# Patient Record
Sex: Male | Born: 1945
Health system: Southern US, Community
[De-identification: ages and names within clinical notes are randomized; demographics above are authoritative.]

## PROBLEM LIST (undated history)

## (undated) DIAGNOSIS — E7800 Pure hypercholesterolemia, unspecified: Secondary | ICD-10-CM

## (undated) DIAGNOSIS — R001 Bradycardia, unspecified: Secondary | ICD-10-CM

## (undated) DIAGNOSIS — I340 Nonrheumatic mitral (valve) insufficiency: Secondary | ICD-10-CM

## (undated) DIAGNOSIS — G8929 Other chronic pain: Secondary | ICD-10-CM

## (undated) DIAGNOSIS — N529 Male erectile dysfunction, unspecified: Secondary | ICD-10-CM

## (undated) DIAGNOSIS — I351 Nonrheumatic aortic (valve) insufficiency: Secondary | ICD-10-CM

## (undated) DIAGNOSIS — M549 Dorsalgia, unspecified: Secondary | ICD-10-CM

## (undated) DIAGNOSIS — I491 Atrial premature depolarization: Secondary | ICD-10-CM

## (undated) DIAGNOSIS — I493 Ventricular premature depolarization: Secondary | ICD-10-CM

## (undated) DIAGNOSIS — K219 Gastro-esophageal reflux disease without esophagitis: Secondary | ICD-10-CM

## (undated) DIAGNOSIS — I471 Supraventricular tachycardia: Secondary | ICD-10-CM

## (undated) DIAGNOSIS — N400 Enlarged prostate without lower urinary tract symptoms: Secondary | ICD-10-CM

## (undated) DIAGNOSIS — I483 Typical atrial flutter: Secondary | ICD-10-CM

## (undated) DIAGNOSIS — I4719 Other supraventricular tachycardia: Secondary | ICD-10-CM

## (undated) HISTORY — DX: Nonrheumatic mitral (valve) insufficiency: I34.0

## (undated) HISTORY — DX: Gastro-esophageal reflux disease without esophagitis: K21.9

## (undated) HISTORY — DX: Pure hypercholesterolemia, unspecified: E78.00

## (undated) HISTORY — DX: Dorsalgia, unspecified: M54.9

## (undated) HISTORY — DX: Atrial premature depolarization: I49.1

## (undated) HISTORY — DX: Nonrheumatic aortic (valve) insufficiency: I35.1

## (undated) HISTORY — DX: Other chronic pain: G89.29

## (undated) HISTORY — PX: APPENDECTOMY: SHX54

## (undated) HISTORY — DX: Male erectile dysfunction, unspecified: N52.9

## (undated) HISTORY — DX: Ventricular premature depolarization: I49.3

## (undated) HISTORY — DX: Benign prostatic hyperplasia without lower urinary tract symptoms: N40.0

## (undated) HISTORY — DX: Supraventricular tachycardia: I47.1

## (undated) HISTORY — DX: Typical atrial flutter: I48.3

## (undated) HISTORY — DX: Other supraventricular tachycardia: I47.19

## (undated) HISTORY — DX: Bradycardia, unspecified: R00.1

---

## 1965-12-22 HISTORY — PX: TONSILECTOMY/ADENOIDECTOMY WITH MYRINGOTOMY: SHX6125

## 1998-07-19 ENCOUNTER — Ambulatory Visit (HOSPITAL_COMMUNITY): Admission: RE | Admit: 1998-07-19 | Discharge: 1998-07-19 | Payer: Self-pay | Admitting: Emergency Medicine

## 1998-07-19 ENCOUNTER — Emergency Department (HOSPITAL_COMMUNITY): Admission: EM | Admit: 1998-07-19 | Discharge: 1998-07-19 | Payer: Self-pay | Admitting: Emergency Medicine

## 2001-01-26 ENCOUNTER — Ambulatory Visit (HOSPITAL_COMMUNITY): Admission: RE | Admit: 2001-01-26 | Discharge: 2001-01-26 | Payer: Self-pay | Admitting: Gastroenterology

## 2003-07-10 ENCOUNTER — Encounter: Payer: Self-pay | Admitting: Internal Medicine

## 2003-07-10 ENCOUNTER — Encounter: Admission: RE | Admit: 2003-07-10 | Discharge: 2003-07-10 | Payer: Self-pay | Admitting: Internal Medicine

## 2010-01-16 ENCOUNTER — Observation Stay (HOSPITAL_COMMUNITY): Admission: EM | Admit: 2010-01-16 | Discharge: 2010-01-17 | Payer: Self-pay | Admitting: Emergency Medicine

## 2010-01-16 ENCOUNTER — Ambulatory Visit: Payer: Self-pay | Admitting: Cardiology

## 2011-03-10 LAB — CK TOTAL AND CKMB (NOT AT ARMC)
CK, MB: 2.4 ng/mL (ref 0.3–4.0)
Total CK: 124 U/L (ref 7–232)

## 2011-03-10 LAB — DIFFERENTIAL
Eosinophils Absolute: 0.1 10*3/uL (ref 0.0–0.7)
Lymphocytes Relative: 22 % (ref 12–46)
Monocytes Absolute: 0.4 10*3/uL (ref 0.1–1.0)
Monocytes Relative: 8 % (ref 3–12)
Neutrophils Relative %: 68 % (ref 43–77)

## 2011-03-10 LAB — CARDIAC PANEL(CRET KIN+CKTOT+MB+TROPI)
CK, MB: 2 ng/mL (ref 0.3–4.0)
CK, MB: 2 ng/mL (ref 0.3–4.0)
Total CK: 78 U/L (ref 7–232)
Troponin I: 0.04 ng/mL (ref 0.00–0.06)

## 2011-03-10 LAB — CBC
HCT: 44.6 % (ref 39.0–52.0)
MCV: 86.6 fL (ref 78.0–100.0)
Platelets: 270 10*3/uL (ref 150–400)
RDW: 12.8 % (ref 11.5–15.5)

## 2011-03-10 LAB — COMPREHENSIVE METABOLIC PANEL
ALT: 17 U/L (ref 0–53)
Alkaline Phosphatase: 64 U/L (ref 39–117)
CO2: 26 mEq/L (ref 19–32)
Calcium: 9.3 mg/dL (ref 8.4–10.5)
Creatinine, Ser: 1.23 mg/dL (ref 0.4–1.5)
GFR calc Af Amer: >60 mL/min (ref >60)
GFR calc non Af Amer: 59 mL/min — ABNORMAL LOW (ref >60)
Total Protein: 7.4 g/dL (ref 6.0–8.3)

## 2011-03-10 LAB — TROPONIN I: Troponin I: 0.02 ng/mL (ref 0.00–0.06)

## 2013-05-26 ENCOUNTER — Ambulatory Visit: Payer: Medicare Other | Attending: Family Medicine | Admitting: Rehabilitation

## 2013-05-26 DIAGNOSIS — IMO0001 Reserved for inherently not codable concepts without codable children: Secondary | ICD-10-CM | POA: Insufficient documentation

## 2013-05-26 DIAGNOSIS — M25539 Pain in unspecified wrist: Secondary | ICD-10-CM | POA: Insufficient documentation

## 2013-08-01 ENCOUNTER — Other Ambulatory Visit: Payer: Self-pay | Admitting: Orthopaedic Surgery

## 2013-08-01 DIAGNOSIS — M25521 Pain in right elbow: Secondary | ICD-10-CM

## 2013-08-06 ENCOUNTER — Ambulatory Visit (HOSPITAL_BASED_OUTPATIENT_CLINIC_OR_DEPARTMENT_OTHER)
Admission: RE | Admit: 2013-08-06 | Discharge: 2013-08-06 | Disposition: A | Discharge status: Home / Self Care | Payer: Medicare Other | Source: Ambulatory Visit | Attending: Orthopaedic Surgery | Admitting: Orthopaedic Surgery

## 2013-08-06 DIAGNOSIS — M6688 Spontaneous rupture of other tendons, other: Secondary | ICD-10-CM | POA: Insufficient documentation

## 2013-08-06 DIAGNOSIS — M25521 Pain in right elbow: Secondary | ICD-10-CM

## 2013-08-09 ENCOUNTER — Other Ambulatory Visit (HOSPITAL_BASED_OUTPATIENT_CLINIC_OR_DEPARTMENT_OTHER): Payer: Medicare Other

## 2013-08-22 HISTORY — PX: TRICEPS TENDON REPAIR: SHX2577

## 2013-09-08 ENCOUNTER — Ambulatory Visit: Payer: Medicare Other | Attending: Orthopaedic Surgery | Admitting: Physical Therapy

## 2013-09-08 DIAGNOSIS — R293 Abnormal posture: Secondary | ICD-10-CM | POA: Insufficient documentation

## 2013-09-08 DIAGNOSIS — M25539 Pain in unspecified wrist: Secondary | ICD-10-CM | POA: Insufficient documentation

## 2013-09-08 DIAGNOSIS — M25629 Stiffness of unspecified elbow, not elsewhere classified: Secondary | ICD-10-CM | POA: Insufficient documentation

## 2013-09-08 DIAGNOSIS — IMO0001 Reserved for inherently not codable concepts without codable children: Secondary | ICD-10-CM | POA: Insufficient documentation

## 2013-09-08 DIAGNOSIS — R5381 Other malaise: Secondary | ICD-10-CM | POA: Insufficient documentation

## 2013-09-13 ENCOUNTER — Ambulatory Visit: Payer: Medicare Other | Admitting: Physical Therapy

## 2013-09-15 ENCOUNTER — Ambulatory Visit: Payer: Medicare Other | Admitting: Physical Therapy

## 2013-09-22 ENCOUNTER — Encounter: Payer: Medicare Other | Admitting: Physical Therapy

## 2013-10-03 ENCOUNTER — Encounter: Payer: Medicare Other | Admitting: Physical Therapy

## 2013-10-11 ENCOUNTER — Ambulatory Visit: Payer: Medicare Other | Admitting: Physical Therapy

## 2013-10-13 ENCOUNTER — Ambulatory Visit: Payer: Medicare Other | Attending: Orthopaedic Surgery | Admitting: Physical Therapy

## 2013-10-13 DIAGNOSIS — IMO0001 Reserved for inherently not codable concepts without codable children: Secondary | ICD-10-CM | POA: Insufficient documentation

## 2013-10-13 DIAGNOSIS — R293 Abnormal posture: Secondary | ICD-10-CM | POA: Insufficient documentation

## 2013-10-13 DIAGNOSIS — R5381 Other malaise: Secondary | ICD-10-CM | POA: Insufficient documentation

## 2013-10-13 DIAGNOSIS — M25539 Pain in unspecified wrist: Secondary | ICD-10-CM | POA: Insufficient documentation

## 2013-10-13 DIAGNOSIS — M25629 Stiffness of unspecified elbow, not elsewhere classified: Secondary | ICD-10-CM | POA: Insufficient documentation

## 2013-10-20 ENCOUNTER — Ambulatory Visit: Payer: Medicare Other | Admitting: Physical Therapy

## 2013-10-24 ENCOUNTER — Ambulatory Visit: Payer: Medicare Other | Admitting: Physical Therapy

## 2013-10-27 ENCOUNTER — Ambulatory Visit: Payer: Medicare Other | Attending: Orthopaedic Surgery | Admitting: Physical Therapy

## 2013-10-27 DIAGNOSIS — R293 Abnormal posture: Secondary | ICD-10-CM | POA: Insufficient documentation

## 2013-10-27 DIAGNOSIS — R5381 Other malaise: Secondary | ICD-10-CM | POA: Insufficient documentation

## 2013-10-27 DIAGNOSIS — M25539 Pain in unspecified wrist: Secondary | ICD-10-CM | POA: Insufficient documentation

## 2013-10-27 DIAGNOSIS — IMO0001 Reserved for inherently not codable concepts without codable children: Secondary | ICD-10-CM | POA: Insufficient documentation

## 2013-10-27 DIAGNOSIS — M25629 Stiffness of unspecified elbow, not elsewhere classified: Secondary | ICD-10-CM | POA: Insufficient documentation

## 2016-01-01 DIAGNOSIS — R7301 Impaired fasting glucose: Secondary | ICD-10-CM | POA: Diagnosis not present

## 2016-07-11 DIAGNOSIS — E784 Other hyperlipidemia: Secondary | ICD-10-CM | POA: Diagnosis not present

## 2016-07-11 DIAGNOSIS — Z125 Encounter for screening for malignant neoplasm of prostate: Secondary | ICD-10-CM | POA: Diagnosis not present

## 2016-07-11 DIAGNOSIS — R7301 Impaired fasting glucose: Secondary | ICD-10-CM | POA: Diagnosis not present

## 2016-07-18 ENCOUNTER — Encounter: Payer: Self-pay | Admitting: Internal Medicine

## 2016-07-18 DIAGNOSIS — R001 Bradycardia, unspecified: Secondary | ICD-10-CM | POA: Diagnosis not present

## 2016-07-18 DIAGNOSIS — Z0000 Encounter for general adult medical examination without abnormal findings: Secondary | ICD-10-CM | POA: Diagnosis not present

## 2016-07-18 DIAGNOSIS — I4892 Unspecified atrial flutter: Secondary | ICD-10-CM | POA: Diagnosis not present

## 2016-07-18 DIAGNOSIS — R600 Localized edema: Secondary | ICD-10-CM | POA: Diagnosis not present

## 2016-07-18 DIAGNOSIS — I499 Cardiac arrhythmia, unspecified: Secondary | ICD-10-CM | POA: Diagnosis not present

## 2016-07-18 DIAGNOSIS — M5489 Other dorsalgia: Secondary | ICD-10-CM | POA: Diagnosis not present

## 2016-07-18 DIAGNOSIS — Z6827 Body mass index (BMI) 27.0-27.9, adult: Secondary | ICD-10-CM | POA: Diagnosis not present

## 2016-07-18 DIAGNOSIS — Z1212 Encounter for screening for malignant neoplasm of rectum: Secondary | ICD-10-CM | POA: Diagnosis not present

## 2016-07-18 DIAGNOSIS — M7021 Olecranon bursitis, right elbow: Secondary | ICD-10-CM | POA: Diagnosis not present

## 2016-07-18 DIAGNOSIS — R7301 Impaired fasting glucose: Secondary | ICD-10-CM | POA: Diagnosis not present

## 2016-07-18 DIAGNOSIS — F418 Other specified anxiety disorders: Secondary | ICD-10-CM | POA: Diagnosis not present

## 2016-07-18 DIAGNOSIS — E784 Other hyperlipidemia: Secondary | ICD-10-CM | POA: Diagnosis not present

## 2016-07-18 DIAGNOSIS — Z1389 Encounter for screening for other disorder: Secondary | ICD-10-CM | POA: Diagnosis not present

## 2016-07-23 DIAGNOSIS — R600 Localized edema: Secondary | ICD-10-CM | POA: Diagnosis not present

## 2016-07-23 DIAGNOSIS — M7122 Synovial cyst of popliteal space [Baker], left knee: Secondary | ICD-10-CM | POA: Diagnosis not present

## 2016-07-24 ENCOUNTER — Encounter: Payer: Self-pay | Admitting: Internal Medicine

## 2016-07-24 ENCOUNTER — Encounter (INDEPENDENT_AMBULATORY_CARE_PROVIDER_SITE_OTHER): Payer: Self-pay

## 2016-07-24 ENCOUNTER — Ambulatory Visit (INDEPENDENT_AMBULATORY_CARE_PROVIDER_SITE_OTHER): Payer: PPO | Admitting: Internal Medicine

## 2016-07-24 VITALS — BP 125/84 | HR 74 | Ht 74.0 in | Wt 217.0 lb

## 2016-07-24 DIAGNOSIS — I4892 Unspecified atrial flutter: Secondary | ICD-10-CM | POA: Diagnosis not present

## 2016-07-24 NOTE — Progress Notes (Signed)
ELECTROPHYSIOLOGY CONSULT NOTE  Patient ID: Austin Parks, MRN: [id number], DOB/AGE: [birth year] 70 y.o. Admit date: (Not on file) Date of Consult: 07/24/2016  Primary Physician: No primary care provider on file. Primary Cardiologist: new  `   Consulting Physician MPerini  Chief Complaint: atrial    HPI Austin Parks is a 70 y.o. male  Referred because of atrial flutter. It was identified during a physical examination. The patient has had no attributable symptoms. There are no palpitations he has noted no change in exercise tolerance.  CHADS-VASc score is 1-age  He is able to climb stairs he is able to play golfperipheral edema orthopnea. He has had no exertional chest discomfort.  He has no history of bleeding disorder. His past medical record describes bradycardia.      Past Medical History:  Diagnosis Date  . Back pain, chronic   . Benign prostatic hypertrophy    with obstruction  . Bradycardia    ectopic  . Erectile dysfunction of organic origin   . Gastroesophageal reflux disease   . Hypercholesterolemia    borderline. admitted january 2011 for chest pain-all stress related it was felt to be a-flutter 7/17      Surgical History:  Past Surgical History:  Procedure Laterality Date  . TRICEPS TENDON REPAIR  08/2013   Dr. [name]     Home Meds: Prior to Admission medications   Medication Sig Start Date End Date Taking? Authorizing Provider  aspirin 81 MG tablet Take 81 mg by mouth once a week.    Yes Historical Provider, MD  cholecalciferol (VITAMIN D) 1000 units tablet Take 2,000 Units by mouth daily.   Yes Historical Provider, MD  Coenzyme Q10 100 MG CHEW Chew 2 tablets by mouth daily.   Yes Historical Provider, MD  Multiple Vitamins-Minerals (MULTIVITAMIN ADULT PO) Take 1 tablet by mouth daily. Centrum Silver Senior for men   Yes Historical Provider, MD  Omega-3 Fatty Acids (FISH OIL PO) Take 1,200 mg by mouth daily.   Yes Historical Provider,  MD  rosuvastatin (CRESTOR) 40 MG tablet Take by mouth at bedtime. Take 0.5 tablet by mouth daily   Yes Historical Provider, MD    Allergies: No Known Allergies  Social History   Social History  . Marital status: Married    Spouse name: N/A  . Number of children: 2  . Years of education: college   Occupational History  . 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    Social History Main Topics  . Smoking status: Never Smoker  . Smokeless tobacco: Former Systems developer  . Alcohol use No  . Drug use: No  . Sexual activity: Not on file   Other Topics Concern  . 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 He lives with his 2nd wife of 30+ years (prior marriage lasted 3 years) and has 2 children. Patient denies history of alcohol and tobacco use. [name] and [name] children. [name] is peds resident at [facility] as of 2011) retired 2/12. In 2016 still working some. Part time job at [employer] and [employer].     Family History  Problem Relation Age of Onset  . Lung cancer Mother   . Brain cancer Mother   . Stomach cancer Father   . Diabetes Mellitus II Paternal Aunt   . Diabetes Mellitus I Daughter  ROS:  Please see the history of present illness.     All other systems reviewed and negative.    Physical Exam: Blood pressure 125/84, pulse 74, height 6\' 2"  (1.88 m), weight 217 lb (98.4 kg), SpO2 97 %. General: Well developed, well nourished male in no acute distress. Head: Normocephalic, atraumatic, sclera non-icteric, no xanthomas, nares are without discharge. EENT: normal  Lymph Nodes:  none Neck: Negative for carotid bruits. JVD not elevated. Back:without scoliosis kyphosis  Lungs: Clear bilaterally to auscultation without wheezes, rales, or rhonchi. Breathing is unlabored. Heart: RRR with S1 S2. No   murmur . No rubs, or gallops appreciated. Abdomen: Soft, non-tender,  non-distended with normoactive bowel sounds. No hepatomegaly. No rebound/guarding. No obvious abdominal masses. Msk:  Strength and tone appear normal for age. Extremities: No clubbing or cyanosis. No edema.  Distal pedal pulses are 2+ and equal bilaterally. Skin: Warm and Dry Neuro: Alert and oriented X 3. CN III-XII intact Grossly normal sensory and motor function . Psych:  Responds to questions appropriately with a normal affect.      Labs: Cardiac Enzymes No results for input(s): CKTOTAL, CKMB, TROPONINI in the last 72 hours. CBC Lab Results  Component Value Date   WBC 5.2 01/16/2010   HGB 15.2 01/16/2010   HCT 44.6 01/16/2010   MCV 86.6 01/16/2010   PLT 270 01/16/2010   PROTIME: No results for input(s): LABPROT, INR in the last 72 hours. Chemistry No results for input(s): NA, K, CL, CO2, BUN, CREATININE, CALCIUM, PROT, BILITOT, ALKPHOS, ALT, AST, GLUCOSE in the last 168 hours.  Invalid input(s): LABALBU Lipids No results found for: CHOL, HDL, LDLCALC, TRIG BNP No results found for: PROBNP Thyroid Function Tests: No results for input(s): TSH, T4TOTAL, T3FREE, THYROIDAB in the last 72 hours.  Invalid input(s): FREET3 Miscellaneous Lab Results  Component Value Date   DDIMER  01/16/2010    <0.22        AT THE INHOUSE ESTABLISHED CUTOFF VALUE OF 0.48 ug/mL FEU, THIS ASSAY HAS BEEN DOCUMENTED IN THE LITERATURE TO HAVE A SENSITIVITY AND NEGATIVE PREDICTIVE VALUE OF AT LEAST 98 TO 99%.  THE TEST RESULT SHOULD BE CORRELATED WITH AN ASSESSMENT OF THE CLINICAL PROBABILITY OF DVT / VTE.    Radiology/Studies:  No results found.  EKG: Atrial flutter-atypical with upright flutter waves inferiorly negative flutter waves in V1 atrial cycle length 220 ms   Assessment and Plan:    Atrial flutter-atypical    the patient has atypical atrial flutter. In the absence of prior cardiac instrumentation, it is certainly more likely than not that it represents reverse typical  flutter. Hence, catheter ablation becomes a reasonable primary therapy. His atrial flutter is of unknown duration so antecedent anticoagulation is required of greater than 3 weeks. In the absence of symptoms there is no justification for doing a TEE.  We have reviewed from embolic risks for a CHADS-VASc score of 1, intracranial hemorrhage risks associated with NOAC therapy and the relative risks of ablative therapy.  We will anticipate a echocardiogram to look at left ventricular function left atrial size. We anticipate proceeding with catheter ablation the risks and benefits of which we have reviewed and postprocedural anticoagulation for at least 4 weeks.  We have discussed the potential for atrial fibrillation in the wake of atrial flutter ablation. He will learn to track his pulse to help identify atrial fibrillation in the event that it emerges  We discussed the lack of long-term utility of cardioversion. Atrial flutter almost  always recurs. His absence of symptoms obviates   other benefit.    [name]

## 2016-07-24 NOTE — Patient Instructions (Signed)
Medication Instructions: - Your physician recommends that you continue on your current medications as directed. Please refer to the Current Medication list given to you today.  - Please call when you decide which blood thinner you want to start 1) Eliquis one tablet twice daily 2) Pradaxa one tablet twice daily 3) Xarelto one tablet once daily  Labwork: - pending procedure date  Procedures/Testing: - Your physician has requested that you have an echocardiogram. Echocardiography is a painless test that uses sound waves to create images of your heart. It provides your doctor with information about the size and shape of your heart and how well your heart's chambers and valves are working. This procedure takes approximately one hour. There are no restrictions for this procedure.  - Your physician has recommended that you have an ablation- in about 1 month- Kajal Scalici, RN for Dr. Caryl Comes will call you to schedule.. Catheter ablation is a medical procedure used to treat some cardiac arrhythmias (irregular heartbeats). During catheter ablation, a long, thin, flexible tube is put into a blood vessel in your groin (upper thigh), or neck. This tube is called an ablation catheter. It is then guided to your heart through the blood vessel. Radio frequency waves destroy small areas of heart tissue where abnormal heartbeats may cause an arrhythmia to start. Please see the instruction sheet given to you today.  Follow-Up: - pending procedure date  Any Additional Special Instructions Will Be Listed Below (If Applicable).     If you need a refill on your cardiac medications before your next appointment, please call your pharmacy.

## 2016-07-25 ENCOUNTER — Telehealth: Payer: Self-pay | Admitting: Internal Medicine

## 2016-07-25 MED ORDER — APIXABAN 5 MG PO TABS: 5.0000 mg | ORAL_TABLET | Freq: Two times a day (BID) | ORAL | 6 refills | Status: DC

## 2016-07-25 NOTE — Telephone Encounter (Signed)
New message       Pt c/o medication issue:  1. Name of Medication: Eliquis 2. How are you currently taking this medication (dosage and times per day)? uncertain  3. Are you having a reaction (difficulty breathing--STAT)? no  4. What is your medication issue? Pt was instructed to call back and asked to choose the blood thinner, the pt uses [facility], the Md is going to call it in for the pt

## 2016-07-25 NOTE — Telephone Encounter (Signed)
ELIQUIS  5 MB  BID  SENT  VIA  EPIC  TO   [facility]  AT PT'S REQUEST .Adonis Housekeeper

## 2016-08-06 ENCOUNTER — Other Ambulatory Visit: Payer: Self-pay

## 2016-08-06 ENCOUNTER — Ambulatory Visit (HOSPITAL_COMMUNITY): Payer: PPO | Attending: Cardiology

## 2016-08-06 DIAGNOSIS — I351 Nonrheumatic aortic (valve) insufficiency: Secondary | ICD-10-CM | POA: Insufficient documentation

## 2016-08-06 DIAGNOSIS — I071 Rheumatic tricuspid insufficiency: Secondary | ICD-10-CM | POA: Diagnosis not present

## 2016-08-06 DIAGNOSIS — I4892 Unspecified atrial flutter: Secondary | ICD-10-CM | POA: Insufficient documentation

## 2016-08-06 DIAGNOSIS — I517 Cardiomegaly: Secondary | ICD-10-CM | POA: Insufficient documentation

## 2016-08-06 DIAGNOSIS — I340 Nonrheumatic mitral (valve) insufficiency: Secondary | ICD-10-CM | POA: Insufficient documentation

## 2016-08-06 DIAGNOSIS — E7800 Pure hypercholesterolemia, unspecified: Secondary | ICD-10-CM | POA: Diagnosis not present

## 2016-08-13 ENCOUNTER — Telehealth: Payer: Self-pay | Admitting: Internal Medicine

## 2016-08-13 DIAGNOSIS — I4892 Unspecified atrial flutter: Secondary | ICD-10-CM

## 2016-08-13 DIAGNOSIS — Z01812 Encounter for preprocedural laboratory examination: Secondary | ICD-10-CM

## 2016-08-13 NOTE — Telephone Encounter (Signed)
Spoke with patient and advised I will forward message to Dr. Olin Pia primary nurse, Alvis Lemmings, RN, for follow up.  He thanked me for the call.

## 2016-08-13 NOTE — Telephone Encounter (Signed)
New message    Patient calling checking on the status of upcoming ablation. Patient states he's been on blood thinner for 20 days.

## 2016-08-15 ENCOUNTER — Telehealth: Payer: Self-pay | Admitting: Internal Medicine

## 2016-08-15 NOTE — Telephone Encounter (Signed)
Follow Up:   Pt received echo results on my chart. Please call,still have some questions.

## 2016-08-19 ENCOUNTER — Encounter: Payer: Self-pay | Admitting: *Deleted

## 2016-08-19 NOTE — Telephone Encounter (Signed)
I called and spoke with the patient. He is scheduled for his a-flutter ablation for 08/22/16 at 7:30am.  He will come to the office tomorrow for labs/ EKG and formal instructions for his procedure on Friday .

## 2016-08-20 ENCOUNTER — Ambulatory Visit (INDEPENDENT_AMBULATORY_CARE_PROVIDER_SITE_OTHER): Payer: PPO | Admitting: *Deleted

## 2016-08-20 ENCOUNTER — Other Ambulatory Visit: Payer: PPO | Admitting: *Deleted

## 2016-08-20 DIAGNOSIS — Z01812 Encounter for preprocedural laboratory examination: Secondary | ICD-10-CM | POA: Diagnosis not present

## 2016-08-20 DIAGNOSIS — I4892 Unspecified atrial flutter: Secondary | ICD-10-CM

## 2016-08-20 LAB — BASIC METABOLIC PANEL
BUN: 16 mg/dL (ref 7–25)
CHLORIDE: 102 mmol/L (ref 98–110)
CO2: 23 mmol/L (ref 20–31)
Calcium: 9.2 mg/dL (ref 8.6–10.3)
Creat: 1.11 mg/dL (ref 0.70–1.18)
Glucose, Bld: 101 mg/dL — ABNORMAL HIGH (ref 65–99)
POTASSIUM: 4.4 mmol/L (ref 3.5–5.3)
SODIUM: 137 mmol/L (ref 135–146)

## 2016-08-20 LAB — CBC WITH DIFFERENTIAL/PLATELET
BASOS PCT: 1 %
Basophils Absolute: 49 cells/uL (ref 0–200)
EOS PCT: 4 %
Eosinophils Absolute: 196 cells/uL (ref 15–500)
HEMATOCRIT: 42.3 % (ref 38.5–50.0)
Hemoglobin: 14.3 g/dL (ref 13.2–17.1)
LYMPHS PCT: 28 %
Lymphs Abs: 1372 cells/uL (ref 850–3900)
MCH: 29.2 pg (ref 27.0–33.0)
MCHC: 33.8 g/dL (ref 32.0–36.0)
MCV: 86.3 fL (ref 80.0–100.0)
MONO ABS: 441 {cells}/uL (ref 200–950)
MONOS PCT: 9 %
MPV: 8.7 fL (ref 7.5–12.5)
NEUTROS PCT: 58 %
Neutro Abs: 2842 cells/uL (ref 1500–7800)
PLATELETS: 229 10*3/uL (ref 140–400)
RBC: 4.9 MIL/uL (ref 4.20–5.80)
RDW: 13.6 % (ref 11.0–15.0)
WBC: 4.9 10*3/uL (ref 3.8–10.8)

## 2016-08-20 NOTE — Progress Notes (Signed)
  1.) Reason for visit: - pre procedure EKG- pending atrial flutter ablation on 08/22/16  2.) Name of MD requesting visit: Caryl Comes  3.) H&P: Patient is currently pending an atrial flutter ablation on 08/22/16. He presents to the office today for a pre-procedure EKG to confirm is he is currently in NSR and therefore may hold anticoagulation prior to procedure  4.) ROS related to problem: The patient is currently symptom free in regards to his arrhythmia.   5.) Assessment and plan per MD: Per MD review of the patient's EKG today, he is currently still in atrial flutter- the patient has been advised to continue his Eliquis for his procedure on 08/22/16.

## 2016-08-21 ENCOUNTER — Other Ambulatory Visit: Payer: Self-pay | Admitting: Internal Medicine

## 2016-08-21 DIAGNOSIS — I483 Typical atrial flutter: Secondary | ICD-10-CM

## 2016-08-22 ENCOUNTER — Ambulatory Visit (HOSPITAL_COMMUNITY)
Admission: RE | Admit: 2016-08-22 | Discharge: 2016-08-22 | Disposition: A | Discharge status: Home / Self Care | Payer: PPO | Source: Ambulatory Visit | Attending: Internal Medicine | Admitting: Internal Medicine

## 2016-08-22 ENCOUNTER — Ambulatory Visit (HOSPITAL_COMMUNITY)
Admission: RE | Disposition: A | Discharge status: Home / Self Care | Payer: Self-pay | Source: Ambulatory Visit | Attending: Internal Medicine

## 2016-08-22 ENCOUNTER — Encounter (HOSPITAL_COMMUNITY): Payer: Self-pay | Admitting: Certified Registered Nurse Anesthetist

## 2016-08-22 ENCOUNTER — Ambulatory Visit (HOSPITAL_COMMUNITY): Payer: PPO | Admitting: Certified Registered Nurse Anesthetist

## 2016-08-22 DIAGNOSIS — I4891 Unspecified atrial fibrillation: Secondary | ICD-10-CM | POA: Diagnosis not present

## 2016-08-22 DIAGNOSIS — E7800 Pure hypercholesterolemia, unspecified: Secondary | ICD-10-CM | POA: Insufficient documentation

## 2016-08-22 DIAGNOSIS — N400 Enlarged prostate without lower urinary tract symptoms: Secondary | ICD-10-CM | POA: Insufficient documentation

## 2016-08-22 DIAGNOSIS — I483 Typical atrial flutter: Secondary | ICD-10-CM | POA: Diagnosis not present

## 2016-08-22 DIAGNOSIS — I484 Atypical atrial flutter: Secondary | ICD-10-CM | POA: Diagnosis not present

## 2016-08-22 DIAGNOSIS — K219 Gastro-esophageal reflux disease without esophagitis: Secondary | ICD-10-CM | POA: Insufficient documentation

## 2016-08-22 DIAGNOSIS — Z7982 Long term (current) use of aspirin: Secondary | ICD-10-CM | POA: Insufficient documentation

## 2016-08-22 DIAGNOSIS — I4892 Unspecified atrial flutter: Secondary | ICD-10-CM | POA: Diagnosis not present

## 2016-08-22 HISTORY — PX: ABLATION OF DYSRHYTHMIC FOCUS: SHX254

## 2016-08-22 HISTORY — PX: ELECTROPHYSIOLOGIC STUDY: SHX172A

## 2016-08-22 SURGERY — A-FLUTTER/A-TACH/SVT ABLATION
Anesthesia: General

## 2016-08-22 MED ORDER — ACETAMINOPHEN 325 MG PO TABS: 650.0000 mg | ORAL_TABLET | ORAL | Status: DC | PRN

## 2016-08-22 MED ORDER — MEPERIDINE HCL 25 MG/ML IJ SOLN: 6.2500 mg | INTRAMUSCULAR | Status: DC | PRN

## 2016-08-22 MED ORDER — COENZYME Q10 200 MG PO CAPS: 200.0000 mg | ORAL_CAPSULE | Freq: Every day | ORAL | Status: DC

## 2016-08-22 MED ORDER — BUPIVACAINE HCL (PF) 0.25 % IJ SOLN
INTRAMUSCULAR | Status: AC
  Filled 2016-08-22: qty 30

## 2016-08-22 MED ORDER — FENTANYL CITRATE (PF) 100 MCG/2ML IJ SOLN
INTRAMUSCULAR | Status: DC | PRN
  Administered 2016-08-22 (×3): 25 ug via INTRAVENOUS
  Administered 2016-08-22: 100 ug via INTRAVENOUS

## 2016-08-22 MED ORDER — VITAMIN D 1000 UNITS PO TABS: 2000.0000 [IU] | ORAL_TABLET | Freq: Every day | ORAL | Status: DC

## 2016-08-22 MED ORDER — PROPOFOL 10 MG/ML IV BOLUS
INTRAVENOUS | Status: DC | PRN
  Administered 2016-08-22: 200 mg via INTRAVENOUS

## 2016-08-22 MED ORDER — HYDROMORPHONE HCL 1 MG/ML IJ SOLN: 0.2500 mg | INTRAMUSCULAR | Status: DC | PRN

## 2016-08-22 MED ORDER — HEPARIN (PORCINE) IN NACL 2-0.9 UNIT/ML-% IJ SOLN
INTRAMUSCULAR | Status: DC | PRN
  Administered 2016-08-22: 08:00:00

## 2016-08-22 MED ORDER — SODIUM CHLORIDE 0.9% FLUSH
3.0000 mL | Freq: Two times a day (BID) | INTRAVENOUS | Status: DC
  Administered 2016-08-22: 3 mL via INTRAVENOUS

## 2016-08-22 MED ORDER — PHENYLEPHRINE 40 MCG/ML (10ML) SYRINGE FOR IV PUSH (FOR BLOOD PRESSURE SUPPORT)
PREFILLED_SYRINGE | INTRAVENOUS | Status: DC | PRN
  Administered 2016-08-22: 40 ug via INTRAVENOUS

## 2016-08-22 MED ORDER — ONDANSETRON HCL 4 MG/2ML IJ SOLN: 4.0000 mg | Freq: Four times a day (QID) | INTRAMUSCULAR | Status: DC | PRN

## 2016-08-22 MED ORDER — LIDOCAINE HCL (PF) 1 % IJ SOLN
INTRAMUSCULAR | Status: DC | PRN
  Administered 2016-08-22: 20 mL via INTRADERMAL

## 2016-08-22 MED ORDER — HEPARIN (PORCINE) IN NACL 2-0.9 UNIT/ML-% IJ SOLN
INTRAMUSCULAR | Status: AC
  Filled 2016-08-22: qty 500

## 2016-08-22 MED ORDER — EPHEDRINE SULFATE-NACL 50-0.9 MG/10ML-% IV SOSY
PREFILLED_SYRINGE | INTRAVENOUS | Status: DC | PRN
  Administered 2016-08-22: 5 mg via INTRAVENOUS

## 2016-08-22 MED ORDER — ONDANSETRON HCL 4 MG/2ML IJ SOLN: 4.0000 mg | Freq: Once | INTRAMUSCULAR | Status: DC | PRN

## 2016-08-22 MED ORDER — SODIUM CHLORIDE 0.9 % IV SOLN: INTRAVENOUS | Status: DC

## 2016-08-22 MED ORDER — ROSUVASTATIN CALCIUM 10 MG PO TABS: 20.0000 mg | ORAL_TABLET | Freq: Every day | ORAL | Status: DC

## 2016-08-22 MED ORDER — ONDANSETRON HCL 4 MG/2ML IJ SOLN
INTRAMUSCULAR | Status: DC | PRN
  Administered 2016-08-22: 4 mg via INTRAVENOUS

## 2016-08-22 MED ORDER — SODIUM CHLORIDE 0.9% FLUSH: 3.0000 mL | INTRAVENOUS | Status: DC | PRN

## 2016-08-22 MED ORDER — SODIUM CHLORIDE 0.9 % IV SOLN: 250.0000 mL | INTRAVENOUS | Status: DC | PRN

## 2016-08-22 MED ORDER — APIXABAN 5 MG PO TABS: 5.0000 mg | ORAL_TABLET | Freq: Two times a day (BID) | ORAL | Status: DC

## 2016-08-22 MED ORDER — LIDOCAINE HCL (CARDIAC) 20 MG/ML IV SOLN
INTRAVENOUS | Status: DC | PRN
Start: 2016-08-22 — End: 2016-08-22
  Administered 2016-08-22: 100 mg via INTRAVENOUS

## 2016-08-22 MED ORDER — MIDAZOLAM HCL 5 MG/5ML IJ SOLN
INTRAMUSCULAR | Status: DC | PRN
  Administered 2016-08-22: 2 mg via INTRAVENOUS

## 2016-08-22 MED ORDER — SODIUM CHLORIDE 0.9 % IV SOLN
INTRAVENOUS | Status: DC
  Administered 2016-08-22: 06:00:00 via INTRAVENOUS

## 2016-08-22 SURGICAL SUPPLY — 14 items
BAG SNAP BAND KOVER 36X36 (MISCELLANEOUS) ×2 IMPLANT
BLANKET WARM UNDERBOD FULL ACC (MISCELLANEOUS) ×2 IMPLANT
CATH BLAZERPRIME XP LG CV 10MM (ABLATOR) ×2 IMPLANT
CATH DUODECA/ISMUS 7FR REPROC (CATHETERS) ×2 IMPLANT
CATH OCTAPOLOR 6F 125CM 2-5-2 (CATHETERS) ×2 IMPLANT
CATH QUAD COURNAND 5FR (CATHETERS) ×2 IMPLANT
PACK EP LATEX FREE (CUSTOM PROCEDURE TRAY) ×3
PACK EP LF (CUSTOM PROCEDURE TRAY) IMPLANT
PAD DEFIB LIFELINK (PAD) ×2 IMPLANT
SHEATH ATRIAL FLUTTER SAFL 8F (SHEATH) ×2 IMPLANT
SHEATH PINNACLE 6F 10CM (SHEATH) ×2 IMPLANT
SHEATH PINNACLE 7F 10CM (SHEATH) ×4 IMPLANT
SHEATH PINNACLE 8F 10CM (SHEATH) ×4 IMPLANT
SHIELD RADPAD SCOOP 12X17 (MISCELLANEOUS) ×2 IMPLANT

## 2016-08-22 NOTE — Progress Notes (Signed)
Patient has been seen and cleared for discharge by Dr. Caryl Comes SR on telemetry VSS, bed rest is completed and site stable after ambulation. Activity restrictions have been discussed with the patient F/u has been arranged  Tommye Standard, PA-C  Pt examined and groins ok Instructions given

## 2016-08-22 NOTE — Progress Notes (Signed)
Site area:  Lt groin venous sheaths X2 pulled by Tammy Mink Site Prior to Removal:  Level 0 Pressure Applied For:  15 minutes Manual:   0 Patient Status During Pull:  stable Post Pull Site:  Level  0 Post Pull Instructions Given:  yes Post Pull Pulses Present: yes Dressing Applied:  tegaderm Bedrest begins @  1020 Comments:

## 2016-08-22 NOTE — Transfer of Care (Signed)
Immediate Anesthesia Transfer of Care Note  Patient: Austin Parks  Procedure(s) Performed: Procedure(s): A-Flutter Ablation (N/A)  Patient Location: Cath Lab  Anesthesia Type:General  Level of Consciousness: awake, alert , oriented and patient cooperative  Airway & Oxygen Therapy: Patient Spontanous Breathing and Patient connected to nasal cannula oxygen  Post-op Assessment: Report given to RN, Post -op Vital signs reviewed and stable and Patient moving all extremities X 4  Post vital signs: Reviewed and stable  Last Vitals:  Vitals:   08/22/16 1000 08/22/16 1005  BP: (!) 132/59 132/87  Pulse: 79 78  Resp: 19 16  Temp: 36.5 C     Last Pain:  Vitals:   08/22/16 0533  TempSrc: Oral         Complications: No apparent anesthesia complications

## 2016-08-22 NOTE — Discharge Instructions (Addendum)
Electrical Cardioversion Electrical cardioversion is the delivery of a jolt of electricity to change the rhythm of the heart. Sticky patches or metal paddles are placed on the chest to deliver the electricity from a device. This is done to restore a normal rhythm. A rhythm that is too fast or not regular keeps the heart from pumping well. Electrical cardioversion is done in an emergency if:   There is low or no blood pressure as a result of the heart rhythm.   Normal rhythm must be restored as fast as possible to protect the brain and heart from further damage.   It may save a life. Cardioversion may be done for heart rhythms that are not immediately life threatening, such as atrial fibrillation or flutter, in which:   The heart is beating too fast or is not regular.   Medicine to change the rhythm has not worked.   It is safe to wait in order to allow time for preparation.  Symptoms of the abnormal rhythm are bothersome.  The risk of stroke and other serious problems can be reduced. LET [facility] CARE PROVIDER KNOW ABOUT:   Any allergies you have.  All medicines you are taking, including vitamins, herbs, eye drops, creams, and over-the-counter medicines.  Previous problems you or members of your family have had with the use of anesthetics.   Any blood disorders you have.   Previous surgeries you have had.   Medical conditions you have. RISKS AND COMPLICATIONS  Generally, this is a safe procedure. However, problems can occur and include:   Breathing problems related to the anesthetic used.  A blood clot that breaks free and travels to other parts of your body. This could cause a stroke or other problems. The risk of this is lowered by use of blood-thinning medicine (anticoagulant) prior to the procedure.  Cardiac arrest (rare). BEFORE THE PROCEDURE   You may have tests to detect blood clots in your heart and to evaluate heart function.  You may start taking  anticoagulants so your blood does not clot as easily.   Medicines may be given to help stabilize your heart rate and rhythm. PROCEDURE  You will be given medicine through an IV tube to reduce discomfort and make you sleepy (sedative).   An electrical shock will be delivered. AFTER THE PROCEDURE Your heart rhythm will be watched to make sure it does not change.    This information is not intended to replace advice given to you by your health care provider. Make sure you discuss any questions you have with your health care provider.   Document Released: 11/28/2002 Document Revised: 12/29/2014 Document Reviewed: 06/22/2013 Elsevier Interactive Patient Education [employer]. No driving for 1 week. No lifting over 5 lbs for 1 week. No vigorous or sexual activity for 1 week. You may return to work on 08/29/16. Keep procedure site clean & dry. If you notice increased pain, swelling, bleeding or pus, call/return!  You may shower, but no soaking baths/hot tubs/pools for 1 week.    Information on my medicine - ELIQUIS (apixaban)  Why was Eliquis prescribed for you? Eliquis was prescribed for you to reduce the risk of a blood clot forming that can cause a stroke if you have a medical condition called atrial fibrillation (a type of irregular heartbeat).  What do You need to know about Eliquis ? Take your Eliquis TWICE DAILY - one tablet in the morning and one tablet in the evening with or without food.  If you have difficulty swallowing the tablet whole please discuss with your pharmacist how to take the medication safely.  Take Eliquis exactly as prescribed by your doctor and DO NOT stop taking Eliquis without talking to the doctor who prescribed the medication.  Stopping may increase your risk of developing a stroke.  Refill your prescription before you run out.  After discharge, you should have regular check-up appointments with your healthcare provider that is prescribing your  Eliquis.  In the future your dose may need to be changed if your kidney function or weight changes by a significant amount or as you get older.  What do you do if you miss a dose? If you miss a dose, take it as soon as you remember on the same day and resume taking twice daily.  Do not take more than one dose of ELIQUIS at the same time to make up a missed dose.  Important Safety Information A possible side effect of Eliquis is bleeding. You should call your healthcare provider right away if you experience any of the following: ? Bleeding from an injury or your nose that does not stop. ? Unusual colored urine (red or dark brown) or unusual colored stools (red or black). ? Unusual bruising for unknown reasons. ? A serious fall or if you hit your head (even if there is no bleeding).  Some medicines may interact with Eliquis and might increase your risk of bleeding or clotting while on Eliquis. To help avoid this, consult your healthcare provider or pharmacist prior to using any new prescription or non-prescription medications, including herbals, vitamins, non-steroidal anti-inflammatory drugs (NSAIDs) and supplements.  This website has more information on Eliquis (apixaban): http://www.eliquis.com/eliquis/home

## 2016-08-22 NOTE — Anesthesia Procedure Notes (Signed)
Procedure Name: LMA Insertion Date/Time: 08/22/2016 7:50 AM Performed by: Carney Living Pre-anesthesia Checklist: Patient identified, Emergency Drugs available, Suction available, Patient being monitored and Timeout performed Patient Re-evaluated:Patient Re-evaluated prior to inductionOxygen Delivery Method: Circle system utilized Preoxygenation: Pre-oxygenation with 100% oxygen Intubation Type: IV induction LMA: LMA inserted LMA Size: 5.0 Number of attempts: 1 Placement Confirmation: positive ETCO2 and breath sounds checked- equal and bilateral Tube secured with: Tape Dental Injury: Teeth and Oropharynx as per pre-operative assessment

## 2016-08-22 NOTE — Interval H&P Note (Signed)
History and Physical Interval Note:  08/22/2016 7:35 AM  [place]  has presented today for surgery, with the diagnosis of flutter  The various methods of treatment have been discussed with the patient and family. After consideration of risks, benefits and other options for treatment, the patient has consented to  Procedure(s): A-Flutter Ablation (N/A) as a surgical intervention .  The patient's history has been reviewed, patient examined, no change in status, stable for surgery.  I have reviewed the patient's chart and labs.  Questions were answered to the patient's satisfaction.     [name]  ECG  Atypical Aflutter ? Reverse typical Echo Nl LV function with LAE

## 2016-08-22 NOTE — H&P (View-Only) (Signed)
ELECTROPHYSIOLOGY CONSULT NOTE  Patient ID: Austin Parks, MRN: [id number], DOB/AGE: Dec 06, 1946 70 y.o. Admit date: (Not on file) Date of Consult: 07/24/2016  Primary Physician: No primary care provider on file. Primary Cardiologist: new  `   Consulting Physician MPerini  Chief Complaint: atrial    HPI Austin Parks is a 70 y.o. male  Referred because of atrial flutter. It was identified during a physical examination. The patient has had no attributable symptoms. There are no palpitations he has noted no change in exercise tolerance.  CHADS-VASc score is 1-age  He is able to climb stairs he is able to play golfperipheral edema orthopnea. He has had no exertional chest discomfort.  He has no history of bleeding disorder. His past medical record describes bradycardia.      Past Medical History:  Diagnosis Date  . Back pain, chronic   . Benign prostatic hypertrophy    with obstruction  . Bradycardia    ectopic  . Erectile dysfunction of organic origin   . Gastroesophageal reflux disease   . Hypercholesterolemia    borderline. admitted january 2011 for chest pain-all stress related it was felt to be a-flutter 7/17      Surgical History:  Past Surgical History:  Procedure Laterality Date  . TRICEPS TENDON REPAIR  08/2013   Dr. [name]     Home Meds: Prior to Admission medications   Medication Sig Start Date End Date Taking? Authorizing Provider  aspirin 81 MG tablet Take 81 mg by mouth once a week.    Yes Historical Provider, MD  cholecalciferol (VITAMIN D) 1000 units tablet Take 2,000 Units by mouth daily.   Yes Historical Provider, MD  Coenzyme Q10 100 MG CHEW Chew 2 tablets by mouth daily.   Yes Historical Provider, MD  Multiple Vitamins-Minerals (MULTIVITAMIN ADULT PO) Take 1 tablet by mouth daily. Centrum Silver Senior for men   Yes Historical Provider, MD  Omega-3 Fatty Acids (FISH OIL PO) Take 1,200 mg by mouth daily.   Yes Historical Provider,  MD  rosuvastatin (CRESTOR) 40 MG tablet Take by mouth at bedtime. Take 0.5 tablet by mouth daily   Yes Historical Provider, MD    Allergies: No Known Allergies  Social History   Social History  . Marital status: Married    Spouse name: N/A  . Number of children: 2  . Years of education: college   Occupational History  . 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    Social History Main Topics  . Smoking status: Never Smoker  . Smokeless tobacco: Former Systems developer  . Alcohol use No  . Drug use: No  . Sexual activity: Not on file   Other Topics Concern  . Not on file   Social History Narrative   Mr. 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 He lives with his 2nd wife of 30+ years (prior marriage lasted 3 years) and has 2 children. Patient denies history of alcohol and tobacco use. [name] and [name] children. [name] is peds resident at [facility] as of 2011) retired 2/12. In 2016 still working some. Part time job at [employer] and [employer].     Family History  Problem Relation Age of Onset  . Lung cancer Mother   . Brain cancer Mother   . Stomach cancer Father   . Diabetes Mellitus II Paternal Aunt   . Diabetes Mellitus I Daughter  ROS:  Please see the history of present illness.     All other systems reviewed and negative.    Physical Exam: Blood pressure 125/84, pulse 74, height 6\' 2"  (1.88 m), weight 217 lb (98.4 kg), SpO2 97 %. General: Well developed, well nourished male in no acute distress. Head: Normocephalic, atraumatic, sclera non-icteric, no xanthomas, nares are without discharge. EENT: normal  Lymph Nodes:  none Neck: Negative for carotid bruits. JVD not elevated. Back:without scoliosis kyphosis  Lungs: Clear bilaterally to auscultation without wheezes, rales, or rhonchi. Breathing is unlabored. Heart: RRR with S1 S2. No   murmur . No rubs, or gallops appreciated. Abdomen: Soft, non-tender,  non-distended with normoactive bowel sounds. No hepatomegaly. No rebound/guarding. No obvious abdominal masses. Msk:  Strength and tone appear normal for age. Extremities: No clubbing or cyanosis. No edema.  Distal pedal pulses are 2+ and equal bilaterally. Skin: Warm and Dry Neuro: Alert and oriented X 3. CN III-XII intact Grossly normal sensory and motor function . Psych:  Responds to questions appropriately with a normal affect.      Labs: Cardiac Enzymes No results for input(s): CKTOTAL, CKMB, TROPONINI in the last 72 hours. CBC Lab Results  Component Value Date   WBC 5.2 01/16/2010   HGB 15.2 01/16/2010   HCT 44.6 01/16/2010   MCV 86.6 01/16/2010   PLT 270 01/16/2010   PROTIME: No results for input(s): LABPROT, INR in the last 72 hours. Chemistry No results for input(s): NA, K, CL, CO2, BUN, CREATININE, CALCIUM, PROT, BILITOT, ALKPHOS, ALT, AST, GLUCOSE in the last 168 hours.  Invalid input(s): LABALBU Lipids No results found for: CHOL, HDL, LDLCALC, TRIG BNP No results found for: PROBNP Thyroid Function Tests: No results for input(s): TSH, T4TOTAL, T3FREE, THYROIDAB in the last 72 hours.  Invalid input(s): FREET3 Miscellaneous Lab Results  Component Value Date   DDIMER  01/16/2010    <0.22        AT THE INHOUSE ESTABLISHED CUTOFF VALUE OF 0.48 ug/mL FEU, THIS ASSAY HAS BEEN DOCUMENTED IN THE LITERATURE TO HAVE A SENSITIVITY AND NEGATIVE PREDICTIVE VALUE OF AT LEAST 98 TO 99%.  THE TEST RESULT SHOULD BE CORRELATED WITH AN ASSESSMENT OF THE CLINICAL PROBABILITY OF DVT / VTE.    Radiology/Studies:  No results found.  EKG: Atrial flutter-atypical with upright flutter waves inferiorly negative flutter waves in V1 atrial cycle length 220 ms   Assessment and Plan:    Atrial flutter-atypical    the patient has atypical atrial flutter. In the absence of prior cardiac instrumentation, it is certainly more likely than not that it represents reverse typical  flutter. Hence, catheter ablation becomes a reasonable primary therapy. His atrial flutter is of unknown duration so antecedent anticoagulation is required of greater than 3 weeks. In the absence of symptoms there is no justification for doing a TEE.  We have reviewed from embolic risks for a CHADS-VASc score of 1, intracranial hemorrhage risks associated with NOAC therapy and the relative risks of ablative therapy.  We will anticipate a echocardiogram to look at left ventricular function left atrial size. We anticipate proceeding with catheter ablation the risks and benefits of which we have reviewed and postprocedural anticoagulation for at least 4 weeks.  We have discussed the potential for atrial fibrillation in the wake of atrial flutter ablation. He will learn to track his pulse to help identify atrial fibrillation in the event that it emerges  We discussed the lack of long-term utility of cardioversion. Atrial flutter almost  always recurs. His absence of symptoms obviates   other benefit.    [name]

## 2016-08-22 NOTE — Anesthesia Preprocedure Evaluation (Addendum)
Anesthesia Evaluation  Patient identified by MRN, date of birth, ID band Patient awake    Reviewed: NPO status , Patient's Chart, lab work & pertinent test results  Airway Mallampati: II  TM Distance: >3 FB Neck ROM: Full    Dental  (+) Teeth Intact, Dental Advisory Given   Pulmonary    Pulmonary exam normal        Cardiovascular Normal cardiovascular exam+ dysrhythmias Atrial Fibrillation      Neuro/Psych    GI/Hepatic GERD  Controlled,  Endo/Other    Renal/GU      Musculoskeletal   Abdominal   Peds  Hematology   Anesthesia Other Findings   Reproductive/Obstetrics                            Anesthesia Physical Anesthesia Plan  ASA: II  Anesthesia Plan: General   Post-op Pain Management:    Induction: Intravenous  Airway Management Planned: LMA  Additional Equipment:   Intra-op Plan:   Post-operative Plan: Extubation in OR  Informed Consent: I have reviewed the patients History and Physical, chart, labs and discussed the procedure including the risks, benefits and alternatives for the proposed anesthesia with the patient or authorized representative who has indicated his/her understanding and acceptance.   Dental advisory given  Plan Discussed with: CRNA and Surgeon  Anesthesia Plan Comments:        Anesthesia Quick Evaluation

## 2016-08-22 NOTE — Progress Notes (Signed)
Site area: rt groin venous sheaths X2  pulled by Suella Broad Site Prior to Removal:  Level 0 Pressure Applied For:  15 minutes Manual:   yes Patient Status During Pull:  0 Post Pull Site:  Level  0 Post Pull Instructions Given:  yes Post Pull Pulses Present: yes Dressing Applied:  tegaderm Bedrest begins @  Comments:

## 2016-08-22 NOTE — Anesthesia Postprocedure Evaluation (Signed)
Anesthesia Post Note  Patient: Austin Parks  Procedure(s) Performed: Procedure(s) (LRB): A-Flutter Ablation (N/A)  Patient location during evaluation: PACU Anesthesia Type: General Level of consciousness: awake and alert Pain management: pain level controlled Vital Signs Assessment: post-procedure vital signs reviewed and stable Respiratory status: spontaneous breathing, nonlabored ventilation, respiratory function stable and patient connected to nasal cannula oxygen Cardiovascular status: blood pressure returned to baseline and stable Postop Assessment: no signs of nausea or vomiting Anesthetic complications: no    Last Vitals:  Vitals:   08/22/16 1115 08/22/16 1130  BP: 128/79 129/81  Pulse: 78 79  Resp: 18 18  Temp:      Last Pain:  Vitals:   08/22/16 1043  TempSrc: Oral                 Shad Ledvina DAVID

## 2016-08-23 NOTE — Op Note (Signed)
Austin Parks, AMASON NO.:  [id number]  MEDICAL RECORD NO.:  [id number]  LOCATION:  2W37C                        FACILITY:  [facility]  PHYSICIAN:  [name], MD, FACCDATE OF BIRTH:  1946-10-03  DATE OF PROCEDURE:  08/22/2016 DATE OF DISCHARGE:  08/22/2016                              OPERATIVE REPORT   PREOPERATIVE DIAGNOSIS:  Atypical atrial flutter.  POSTOPERATIVE DIAGNOSIS:  Reverse typical atrial flutter.  PROCEDURES:  Invasive electrophysiological study and catheter ablation.  DESCRIPTION OF PROCEDURE:  Following obtaining informed consent, the patient was brought to the electrophysiology laboratory and placed on the fluoroscopic table in supine position.  After routine prep and drape and sedation under general anesthesia, cardiac catheterization was performed with adjunctive local anesthesia.  Following the procedure, the patient was transferred to the holding area for sheath removal following catheter removal.  CATHETERS:  A 5-French quadripolar catheter was inserted via the left femoral vein to the AV junction. A 6-French octapolar catheter was inserted via the right femoral vein to the coronary sinus. A 7-French dual decapolar catheter was inserted via left femoral vein to the tricuspid annulus. An 8-French 10-mm deflectable tip ablation catheter was inserted via the right femoral vein using a SAFL sheath to mapping sites and posterior septal space.  SURFACE LEADS:  1, AVF, and V1 were monitored continuously throughout the procedure.  Following insertion of the catheters, a stimulation protocol included incremental atrial pacing. Incremental ventricular pacing. Entrainment mapping.  END-TIDAL RESULTS:  End-tidal Surface Electrocardiogram and Baseline Intervals: Rhythm:  Atrial flutter; AA interval 256 milliseconds; RR interval 562 milliseconds; PR interval N/A; QRS duration 84 milliseconds; QT interval 336 milliseconds. AH interval N/A; HV  interval 51 milliseconds. Final:  Rhythm:  Sinus; RR interval 724 milliseconds; PR interval 162 milliseconds; P-wave duration N/N; QRS duration 82 milliseconds; QTc interval 364 milliseconds. AH interval N/M; HV N/M.  AV Nodal Function: AV Wenckebach was 450 milliseconds. VA conduction was dissociated at 600 milliseconds. No evidence of dual AV nodal physiology was identified.  ACCESSORY PATHWAY FUNCTION:  No evidence of accessory pathway was identified. ventricular response to programmed stimulation  Ventricular response to programmed stimulation was normal for ventricular stimulation as described.  Arrhythmias induced.  The patient presented to the lab in atrial flutter.  There was atypical electrocardiographic-appearance entrainment mapping from the coronary sinus, demonstrated a long post-pacing interval.  Entrainment mapping from the tricuspid annulus confirmed cavotricuspid isthmus involvement.  Radiofrequency energy.  A total of 5.9 minutes of RF energy was applied across the cavotricuspid isthmus initially with termination of atrial flutter and then with bidirectional cavotricuspid isthmus conduction block.  FLUOROSCOPY TIME:  A total of 6.9 minutes of fluoroscopy time was utilized.  IMPRESSION: 1. Normal sinus function. 2. Abnormal atrial function manifested by sustained atypical atrial     flutter mapped as reverse typical.  Catheter ablation across the     cavotricuspid isthmus terminated the flutter and resulted in     bidirectional cavotricuspid isthmus conduction block. 3. Normal atrioventricular nodal function. 4. Normal His-Purkinje system function. 5. No accessory pathway. 6. Normal ventricular response to programmed stimulation.  SUMMARY:  In conclusion, the results of electrophysiological testing confirmed cavotricuspid  isthmus-dependent reverse typical atrial flutter.  Catheter ablation successfully terminated the rhythm and resulted in bidirectional  cavotricuspid isthmus conduction block.  The patient will be observed.  Anticoagulation will be continued.  The patient tolerated the procedure without apparent complication. Estimated blood loss was less than 50 mL.  No apparent complications were noted.     [name], MD, [facility]     SCK/MEDQ  D:  08/22/2016  T:  08/23/2016  Job:  [id number]

## 2016-08-26 MED FILL — Bupivacaine HCl Preservative Free (PF) Inj 0.25%: INTRAMUSCULAR | Qty: 30 | Status: AC

## 2016-09-16 DIAGNOSIS — H2513 Age-related nuclear cataract, bilateral: Secondary | ICD-10-CM | POA: Diagnosis not present

## 2016-09-17 NOTE — Progress Notes (Signed)
Electrophysiology Office Note Date: 09/18/2016  ID:  [name], DOB 10/21/1946, MRN [id number]  PCP: Jerlyn Ly, MD Electrophysiologist: Caryl Comes  CC: follow up flutter ablation  Austin Parks is a 70 y.o. male seen today for Dr Caryl Comes.  He presents today for routine electrophysiology followup.  Since last being seen in our clinic, the patient reports doing very well.  He denies chest pain, palpitations, dyspnea, PND, orthopnea, nausea, vomiting, dizziness, syncope, edema, weight gain, or early satiety.  Past Medical History:  Diagnosis Date  . Atrial flutter ([place])   . Back pain, chronic   . Benign prostatic hypertrophy    with obstruction  . Bradycardia    ectopic  . Erectile dysfunction of organic origin   . Gastroesophageal reflux disease   . Hypercholesterolemia    borderline. admitted january 2011 for chest pain-all stress related it was felt to be a-flutter 7/17   Past Surgical History:  Procedure Laterality Date  . ABLATION OF DYSRHYTHMIC FOCUS  08/22/2016  . ELECTROPHYSIOLOGIC STUDY N/A 08/22/2016   Procedure: A-Flutter Ablation;  Surgeon: [name], MD;  Location: [facility];  Service: Cardiovascular;  Laterality: N/A;  . TRICEPS TENDON REPAIR  08/2013   Dr. [name]    Current Outpatient Prescriptions  Medication Sig Dispense Refill  . cholecalciferol (VITAMIN D) 1000 units tablet Take 2,000 Units by mouth daily.    . Coenzyme Q10 200 MG capsule Take 200 mg by mouth daily.     . Multiple Vitamins-Minerals (MULTIVITAMIN ADULT PO) Take 1 tablet by mouth daily. Centrum Firefighter for men    . rosuvastatin (CRESTOR) 40 MG tablet Take 20 mg by mouth at bedtime.      No current facility-administered medications for this visit.     Allergies:   Review of patient's allergies indicates no known allergies.   Social History: Social History   Social History  . Marital status: Married    Spouse name: N/A  . Number of children: 2  .  Years of education: college   Occupational History  . 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    Social History Main Topics  . Smoking status: Never Smoker  . Smokeless tobacco: Former Systems developer  . Alcohol use No  . Drug use: No  . Sexual activity: Not on file   Other Topics Concern  . 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 He lives with his 2nd wife of 30+ years (prior marriage lasted 3 years) and has 2 children. Patient denies history of alcohol and tobacco use. [name] and [name] children. [name] is peds resident at [facility] as of 2011) retired 2/12. In 2016 still working some. Part time job at [employer] and [employer].    Family History: Family History  Problem Relation Age of Onset  . Lung cancer Mother   . Brain cancer Mother   . Stomach cancer Father   . Diabetes Mellitus II Paternal Aunt   . Diabetes Mellitus I Daughter     Review of Systems: All other systems reviewed and are otherwise negative except as noted above.   Physical Exam: VS:  BP 130/76   Pulse 69   Ht 6\' 2"  (1.88 m)   Wt 215 lb 12.8 oz (97.9 kg)   SpO2 96%   BMI 27.71 kg/m  , BMI Body mass  index is 27.71 kg/m. Wt Readings from Last 3 Encounters:  09/18/16 215 lb 12.8 oz (97.9 kg)  08/22/16 210 lb (95.3 kg)  07/24/16 217 lb (98.4 kg)    GEN- The patient is well appearing, alert and oriented x 3 today.   HEENT: normocephalic, atraumatic; sclera clear, conjunctiva pink; hearing intact; oropharynx clear; neck supple  Lungs- Clear to ausculation bilaterally, normal work of breathing.  No wheezes, rales, rhonchi Heart- Regular rate and rhythm, no murmurs, rubs or gallops  GI- soft, non-tender, non-distended, bowel sounds present  Extremities- no clubbing, cyanosis, or edema; DP/PT/radial pulses 2+ bilaterally MS- no significant deformity or atrophy Skin- warm and dry, no rash or lesion    Psych- euthymic mood, full affect Neuro- strength and sensation are intact   EKG:  EKG is ordered today. The ekg ordered today shows sinus rhythm, 1st degree AV block, PR 249msec  Recent Labs: 08/20/2016: BUN 16; Creat 1.11; Hemoglobin 14.3; Platelets 229; Potassium 4.4; Sodium 137    Other studies Reviewed: Additional studies/ records that were reviewed today include: hospital records  Assessment and Plan: 1.  Reverse typical atrial flutter S/p ablation 08/22/16 Maintaining SR Will discontinue OAC at this time Follow up with EP prn Encouraged patient to do occasional pulse checks to screen for AF     Current medicines are reviewed at length with the patient today.   The patient does not have concerns regarding his medicines.  The following changes were made today:  none  Labs/ tests ordered today include:  Orders Placed This Encounter  Procedures  . EKG 12-Lead     Disposition:   Follow up with EP prn    Signed, Chanetta Marshall, NP 09/18/2016 9:47 AM   [facility] [address] [place] [address] [phone] (office) [phone] (fax)

## 2016-09-18 ENCOUNTER — Ambulatory Visit (INDEPENDENT_AMBULATORY_CARE_PROVIDER_SITE_OTHER): Payer: PPO | Admitting: Nurse Practitioner

## 2016-09-18 ENCOUNTER — Encounter: Payer: Self-pay | Admitting: Nurse Practitioner

## 2016-09-18 VITALS — BP 130/76 | HR 69 | Ht 74.0 in | Wt 215.8 lb

## 2016-09-18 DIAGNOSIS — I4892 Unspecified atrial flutter: Secondary | ICD-10-CM | POA: Diagnosis not present

## 2016-09-18 NOTE — Patient Instructions (Signed)
Medication Instructions: Your physician has recommended you make the following change in your medication:   1. STOP Eliquis once you have completed your current prescription   Labwork: None ordered  Procedures/Testing: None Ordered  Follow-Up: Your physician recommends that you schedule a follow-up appointment as needed with EP. You may call [phone] if you have any symptoms and need to be seen in the future.   Any Additional Special Instructions Will Be Listed Below (If Applicable).     If you need a refill on your cardiac medications before your next appointment, please call your pharmacy.  >in

## 2016-10-04 DIAGNOSIS — Z23 Encounter for immunization: Secondary | ICD-10-CM | POA: Diagnosis not present

## 2016-12-26 ENCOUNTER — Ambulatory Visit (INDEPENDENT_AMBULATORY_CARE_PROVIDER_SITE_OTHER): Payer: PPO | Admitting: *Deleted

## 2016-12-26 ENCOUNTER — Telehealth: Payer: Self-pay | Admitting: Internal Medicine

## 2016-12-26 VITALS — HR 64 | Resp 18 | Ht 74.0 in | Wt 223.8 lb

## 2016-12-26 DIAGNOSIS — I499 Cardiac arrhythmia, unspecified: Secondary | ICD-10-CM

## 2016-12-26 NOTE — Telephone Encounter (Signed)
I called and spoke with the patient. He states that for the last week, he has noticed his heart fluttering intermittently, but daily. He feels like this is going on fairly consistently for him this morning.  He is s/p a-flutter ablation on 9/1. He is currently off of his NOAC. I have advised him that he may come in this morning for an EKG to be done. He is agreeable.

## 2016-12-26 NOTE — Progress Notes (Signed)
1.) Reason for visit: EKG  2.) Name of MD requesting visit: Klein/ Seiler  3.) H&P: Patient is s/p atrial flutter ablation from 08/22/16 with Dr. Caryl Comes  4.) ROS related to problem: Patient called the office this morning with complaints of feeling an irregularity to his heart beat for about the last week. He states this feels different than when he had his flutter. He was brought to the office today to confirm his rhythm via EKG. 12 lead EKG reveals Sinus rhythm w/ 1st degree AV block- HR 64. A rhythm strip done on the patients does reveal sinus rhythm with PAC's. The patient is currently asymptomatic. He has been under some stress due to a family member that has been ill and he and his wife have been traveling back and forth to see them.  5.) Assessment and plan per MD: EKG reviewed/ confirmed with Chanetta Marshall, NP. No further recommendations for the patient at this time. He was advised to continue to monitor his heart rate/ rhythm and if he feels he is back in atrial flutter, to please call us back. He verbalizes understanding.

## 2016-12-26 NOTE — Telephone Encounter (Signed)
New Message   Pt had ablation on Sept 1st, feels like heart beat is a little irregular. Not experiencing any pain or SOB. Would like a call back.

## 2017-04-27 ENCOUNTER — Encounter: Payer: Self-pay | Admitting: Physician Assistant

## 2017-04-27 ENCOUNTER — Telehealth: Payer: Self-pay | Admitting: Internal Medicine

## 2017-04-27 DIAGNOSIS — I340 Nonrheumatic mitral (valve) insufficiency: Secondary | ICD-10-CM | POA: Insufficient documentation

## 2017-04-27 DIAGNOSIS — I483 Typical atrial flutter: Secondary | ICD-10-CM | POA: Insufficient documentation

## 2017-04-27 DIAGNOSIS — E7800 Pure hypercholesterolemia, unspecified: Secondary | ICD-10-CM | POA: Insufficient documentation

## 2017-04-27 DIAGNOSIS — I491 Atrial premature depolarization: Secondary | ICD-10-CM | POA: Insufficient documentation

## 2017-04-27 DIAGNOSIS — I351 Nonrheumatic aortic (valve) insufficiency: Secondary | ICD-10-CM | POA: Insufficient documentation

## 2017-04-27 NOTE — Telephone Encounter (Signed)
New Message  Pt call requesting to speak with RN about his heart rate. Pt states his normal HR is 70. pt states since Saturday its been ranging from 173, 122, 180. Please call back to discuss

## 2017-04-27 NOTE — Telephone Encounter (Signed)
Pt does not know his blood pressure.

## 2017-04-27 NOTE — Telephone Encounter (Signed)
Pt states since Saturday he has had heart rates of 170,166,182. Pt denies any symptoms, including lightheadedness, dizziness, palpitations, or any activity that may explain fast heart rates. Pt states he has an Apple heart monitor and when he checks the monitor these are the heart rates that have been recorded. Pt states heart rate at this time 66.

## 2017-04-27 NOTE — Telephone Encounter (Signed)
Pt aware he has been scheduled to see Melina Copa, PA 04/28/17 11 AM [employer].

## 2017-04-27 NOTE — Telephone Encounter (Signed)
I reviewed with Dr Nahser--Dr Acie Fredrickson felt heart rate recordings may be an error given pt is asymptomatic and heart rate is 66 at this time,  but recommended pt follow up with an office visit.

## 2017-04-27 NOTE — Progress Notes (Signed)
Cardiology Office Note    Date:  04/28/2017  ID:  Austin Parks, DOB June 23, 1946, MRN [id number] PCP:  Austin Infante, MD  Cardiologist:  Dr. Caryl Parks   Chief Complaint: Apple watch indicated high heart rate  History of Present Illness:  Austin Parks is a 71 y.o. male with history of reverse typical atrial flutter s/p ablation 08/2016, chronic back pain, BPH, ectopic bradycardia, GERD, HLD, ED who presents for evaluation of fast heart rate. 2D Echo 07/2016 showed EF 55-60%, no RWMA, grade 2 DD, elevated LVEDP, mild AI, mild MR. Underwent ablation 08/22/16. Anticoagulation discontinued 09/18/16 as he was maintaining NSR. He was last seen in the office for nurse visit 12/2016 for irregularity of heartbeat that showed NSR with 1st degree AVB and PACs. Continued surveillance recommended. Last labs 2017 showed glu 101, K 4.4, Cr 1.11, normal CBC.  The patient presents to discuss some abnormal heart rate readings. He has been feeling great without any chest pain, palpitations, shortness of breath, dizziness or syncope. On 5/5 he was walking around at work (not doing anything out of the ordinary) - later in the day he noticed his Apple watch had logged a HR of 185. Yesterday it logged a HR of 170. This morning it showed a HR of 135. Other readings range 60-80s. He was unaware of these events until after the fact - his watch does not notify him if HR is high. He was previously drinking 3 cups of coffee a day but has since decided to cut down. No new meds, either prescription or OTC. Son is the [employer] of [employer] firm.    Past Medical History:  Diagnosis Date  . Back pain, chronic   . Benign prostatic hypertrophy    with obstruction  . Bradycardia    ectopic  . Erectile dysfunction of organic origin   . Gastroesophageal reflux disease   . Hypercholesterolemia    borderline. admitted january 2011 for chest pain-all stress related it was felt to be a-flutter 7/17  . Mild aortic  insufficiency   . Mild mitral regurgitation   . Premature atrial contractions   . Typical atrial flutter (HCC)    a. reverse typical atrial flutter s/p ablation 08/2016.    Past Surgical History:  Procedure Laterality Date  . ABLATION OF DYSRHYTHMIC FOCUS  08/22/2016  . ELECTROPHYSIOLOGIC STUDY N/A 08/22/2016   Procedure: A-Flutter Ablation;  Surgeon: Austin Sprang, MD;  Location: [facility];  Service: Cardiovascular;  Laterality: N/A;  . TRICEPS TENDON REPAIR  08/2013   Dr. Lorin Parks    Current Medications: Current Outpatient Prescriptions  Medication Sig Dispense Refill  . cholecalciferol (VITAMIN D) 1000 units tablet Take 2,000 Units by mouth daily.    . Coenzyme Q10 200 MG capsule Take 200 mg by mouth daily.     . Multiple Vitamins-Minerals (MULTIVITAMIN ADULT PO) Take 1 tablet by mouth daily. Centrum Firefighter for men    . rosuvastatin (CRESTOR) 40 MG tablet Take 20 mg by mouth at bedtime.     . vitamin C (ASCORBIC ACID) 500 MG tablet Take 500 mg by mouth daily. Seasonal: WINTER     No current facility-administered medications for this visit.      Allergies:   Patient has no known allergies.   Social History   Social History  . Marital status: Married    Spouse name: N/A  . Number of children: 2  . Years of education: college   Occupational History  .  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    Social History Main Topics  . Smoking status: Never Smoker  . Smokeless tobacco: Former Systems developer  . Alcohol use No  . Drug use: No  . Sexual activity: Not Asked   Other Topics Concern  . 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 He lives with his 2nd wife of 30+ years (prior marriage lasted 3 years) and has 2 children. Patient denies history of alcohol and tobacco use. [employer] Parks and [employer] children. Austin Parks is peds resident at [facility] as of 2011) retired 2/12. In 2016 still working  some. Part time job at [employer] and [employer].     Family History:  Family History  Problem Relation Age of Onset  . Lung cancer Mother   . Brain cancer Mother   . Stomach cancer Father   . Diabetes Mellitus II Paternal Aunt   . Diabetes Mellitus I Daughter     ROS:   Please see the history of present illness.  All other systems are reviewed and otherwise negative.    PHYSICAL EXAM:   VS:  BP 124/82   Pulse 63   Ht 6\' 3"  (1.905 m)   Wt 217 lb 4 oz (98.5 kg)   SpO2 96%   BMI 27.15 kg/m   BMI: Body mass index is 27.15 kg/m. GEN: Well nourished, well developed, in no acute distress  HEENT: normocephalic, atraumatic Neck: no JVD, carotid bruits, or masses Cardiac: RRR occasional ectopy; no murmurs, rubs, or gallops, no edema  Respiratory:  clear to auscultation bilaterally, normal work of breathing GI: soft, nontender, nondistended, + BS MS: no deformity or atrophy  Skin: warm and dry, no rash Neuro:  Alert and Oriented x 3, Strength and sensation are intact, follows commands Psych: euthymic mood, full affect  Wt Readings from Last 3 Encounters:  04/28/17 217 lb 4 oz (98.5 kg)  12/26/16 223 lb 12 oz (101.5 kg)  09/18/16 215 lb 12.8 oz (97.9 kg)      Studies/Labs Reviewed:   EKG:  EKG was ordered today and personally reviewed by me and demonstrates NSR 61bpm, 1st degree AVB, frequent PACs, nonspecific ST-T changes  Recent Labs: 08/20/2016: BUN 16; Creat 1.11; Hemoglobin 14.3; Platelets 229; Potassium 4.4; Sodium 137   Lipid Panel No results found for: CHOL, TRIG, HDL, CHOLHDL, VLDL, LDLCALC, LDLDIRECT  Additional studies/ records that were reviewed today include: Summarized above    ASSESSMENT & PLAN:   1. Tachycardia - recorded by Apple I-watch. Patient was unaware at the time of the event because this was only upon his review later in the day, so he was unable to correlate event by manual pulse check. I have had patients with similar reports by  FitBit that ultimately turned out to be false positives but also have had a patient wear Apple Watch during ETT with accurate reading within 1 beat of the treadmill tracing. Will check basic labs today to exclude obvious metabolic abnormality. Will arrange 48-hour Holter to assess for correlation. We also discussed AliveCor but this may be less useful to the patient since he was not symptomatic during these episodes. 2. History of atrial flutter - he reports he was asymptomatic at time of diagnosis. F/u Holter monitor. Reported HRs of 170-185 would be higher than expected for atrial flutter. 3. Mild MR/AI - asymptomatic, follow clinically. 4. Frequent PACs -  asymptomatic, follow clinically. F/u labs as above.  Disposition: F/u with EP APP in 1 month.   Medication Adjustments/Labs and Tests Ordered: Current medicines are reviewed at length with the patient today.  Concerns regarding medicines are outlined above. Medication changes, Labs and Tests ordered today are summarized above and listed in the Patient Instructions accessible in Encounters.   Raechel Ache PA-C  04/28/2017 11:06 AM    [facility] [place], [place], [place]  [address] Phone: [phone]; Fax: [phone]

## 2017-04-28 ENCOUNTER — Encounter: Payer: Self-pay | Admitting: Physician Assistant

## 2017-04-28 ENCOUNTER — Ambulatory Visit (INDEPENDENT_AMBULATORY_CARE_PROVIDER_SITE_OTHER): Payer: PPO | Admitting: Physician Assistant

## 2017-04-28 ENCOUNTER — Ambulatory Visit (INDEPENDENT_AMBULATORY_CARE_PROVIDER_SITE_OTHER): Payer: PPO

## 2017-04-28 VITALS — BP 124/82 | HR 63 | Ht 75.0 in | Wt 217.2 lb

## 2017-04-28 DIAGNOSIS — Z8679 Personal history of other diseases of the circulatory system: Secondary | ICD-10-CM

## 2017-04-28 DIAGNOSIS — R000 Tachycardia, unspecified: Secondary | ICD-10-CM | POA: Diagnosis not present

## 2017-04-28 DIAGNOSIS — I351 Nonrheumatic aortic (valve) insufficiency: Secondary | ICD-10-CM

## 2017-04-28 DIAGNOSIS — I495 Sick sinus syndrome: Secondary | ICD-10-CM | POA: Diagnosis not present

## 2017-04-28 DIAGNOSIS — I491 Atrial premature depolarization: Secondary | ICD-10-CM | POA: Diagnosis not present

## 2017-04-28 DIAGNOSIS — I340 Nonrheumatic mitral (valve) insufficiency: Secondary | ICD-10-CM

## 2017-04-28 LAB — CBC
HEMATOCRIT: 44.1 % (ref 37.5–51.0)
Hemoglobin: 15.3 g/dL (ref 13.0–17.7)
MCH: 29.4 pg (ref 26.6–33.0)
MCHC: 34.7 g/dL (ref 31.5–35.7)
MCV: 85 fL (ref 79–97)
PLATELETS: 255 10*3/uL (ref 150–379)
RBC: 5.21 x10E6/uL (ref 4.14–5.80)
RDW: 13 % (ref 12.3–15.4)
WBC: 5 10*3/uL (ref 3.4–10.8)

## 2017-04-28 LAB — BASIC METABOLIC PANEL
BUN / CREAT RATIO: 14 (ref 10–24)
BUN: 15 mg/dL (ref 8–27)
CO2: 24 mmol/L (ref 18–29)
CREATININE: 1.11 mg/dL (ref 0.76–1.27)
Calcium: 9.7 mg/dL (ref 8.6–10.2)
Chloride: 99 mmol/L (ref 96–106)
GFR calc Af Amer: 77 mL/min/{1.73_m2} (ref >59)
GFR, EST NON AFRICAN AMERICAN: 66 mL/min/{1.73_m2} (ref >59)
Glucose: 96 mg/dL (ref 65–99)
Potassium: 5 mmol/L (ref 3.5–5.2)
SODIUM: 140 mmol/L (ref 134–144)

## 2017-04-28 LAB — TSH: TSH: 3.05 u[IU]/mL (ref 0.450–4.500)

## 2017-04-28 LAB — MAGNESIUM: Magnesium: 2.1 mg/dL (ref 1.6–2.3)

## 2017-04-28 NOTE — Patient Instructions (Addendum)
Medication Instructions:  Your physician recommends that you continue on your current medications as directed. Please refer to the Current Medication list given to you today.   Labwork: TODAY:  BMET, CBC, TSH, & MAGNESIUM  Testing/Procedures: Your physician has recommended that you wear a holter monitor. Holter monitors are medical devices that record the heart's electrical activity. Doctors most often use these monitors to diagnose arrhythmias. Arrhythmias are problems with the speed or rhythm of the heartbeat. The monitor is a small, portable device. You can wear one while you do your normal daily activities. This is usually used to diagnose what is causing palpitations/syncope (passing out).    Follow-Up: Your physician recommends that you schedule a follow-up appointment in: 1 MONTH WITH AN EP APP   Any Other Special Instructions Will Be Listed Below (If Applicable).    Holter Monitoring A Holter monitor is a small device that is used to detect abnormal heart rhythms. It clips to your clothing and is connected by wires to flat, sticky disks (electrodes) that attach to your chest. It is worn continuously for 24-48 hours. Follow these instructions at home:  Wear your Holter monitor at all times, even while exercising and sleeping, for as long as directed by your health care provider.  Make sure that the Holter monitor is safely clipped to your clothing or close to your body as recommended by your health care provider.  Do not get the monitor or wires wet.  Do not put body lotion or moisturizer on your chest.  Keep your skin clean.  Keep a diary of your daily activities, such as walking and doing chores. If you feel that your heartbeat is abnormal or that your heart is fluttering or skipping a beat:  Record what you are doing when it happens.  Record what time of day the symptoms occur.  Return your Holter monitor as directed by your health care provider.  Keep all  follow-up visits as directed by your health care provider. This is important. Get help right away if:  You feel lightheaded or you faint.  You have trouble breathing.  You feel pain in your chest, upper arm, or jaw.  You feel sick to your stomach and your skin is pale, cool, or damp.  You heartbeat feels unusual or abnormal. This information is not intended to replace advice given to you by your health care provider. Make sure you discuss any questions you have with your health care provider. Document Released: 09/05/2004 Document Revised: 05/15/2016 Document Reviewed: 07/17/2014 Elsevier Interactive Patient Education  2017 [employer].   If you need a refill on your cardiac medications before your next appointment, please call your pharmacy.

## 2017-05-08 ENCOUNTER — Telehealth: Payer: Self-pay | Admitting: Physician Assistant

## 2017-05-08 NOTE — Telephone Encounter (Signed)
Follow Up:   Pt wants to know if his monitor results are ready please.

## 2017-05-08 NOTE — Telephone Encounter (Signed)
Needs formal MD input regarding official read to interpret what supraventricular runs represent. Can you get one to read study? Thanks! Please let Mr. Norberto know official interpretation is still pending but no sustained arrhythmias that I can see upon initial review but does have very brief runs where HR goes up to 120s. Please ask him if he had any unusual HRs documented on his Apple Watch while wearing monitor and what time this occurred. Will plan to get back with him as soon as we have formal result. Lawrie Tunks PA-C

## 2017-05-08 NOTE — Telephone Encounter (Signed)
Informed patient of [employer]'s advisement. Patient will wait to hear formal results from Dr. Caryl Comes.

## 2017-05-27 ENCOUNTER — Encounter: Payer: Self-pay | Admitting: Physician Assistant

## 2017-05-27 NOTE — Progress Notes (Signed)
Cardiology Office Note    Date:  05/29/2017  ID:  Austin Parks, DOB 09/07/1946, MRN [id number] PCP:  [name], MD  Cardiologist:  Dr. Caryl Comes   Chief Complaint: f/u abnormal heart rate  History of Present Illness:  [employer] Parks is a 71 y.o. male with history of reverse typical atrial flutter s/p ablation 08/2016, chronic back pain, BPH, ectopic bradycardia, GERD, HLD, ED who presents for evaluation of fast heart rate. 2D Echo 07/2016 showed EF 55-60%, no RWMA, grade 2 DD, elevated LVEDP, mild AI, mild MR. Underwent ablation 08/22/16. Anticoagulation discontinued 09/18/16 as he was maintaining NSR. He was seen in the office for nurse visit 12/2016 for irregularity of heartbeat that showed NSR with 1st degree AVB and PACs. More recently he was seen in clinic in 04/2017 to discuss abnormal HR findings incidentally noted as spikes on his Apple watch (185, 170, 135). He was not having any particular symptoms to correlate so it was unclear if they were true readings. He was previously drinking 3 cups of coffee a day but has since decided to cut down. No new meds, either prescription or OTC. Son is the [employer] of [employer] firm. Labs were unremarkable. He underwent Holter monitoring which showed mean HR 71, range 49-114, rare PVCs and PVCs, some ventricular couplets primarily monomorphic, some atrial tach (">>5 beats HR 150 or so), no symptoms reported but two diary events were associated with sinus rhythm.  He returns for follow-up today with his wife. He continues to feel completely asymptomatic and well. 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 He walks a lot for this job (up to 6 miles per Apple watch) and denies any chest pain, palpitations, SOB, exertional fatigue. His Apple watch continues to record values about once a week in the 150-170 range. He only is aware of this in retrospect but does not have any associated symptoms.  Past Medical History:    Diagnosis Date  . Atrial tachycardia ([facility])   . Back pain, chronic   . Benign prostatic hypertrophy    with obstruction  . Bradycardia    ectopic  . Erectile dysfunction of organic origin   . Gastroesophageal reflux disease   . Hypercholesterolemia    borderline. admitted january 2011 for chest pain-all stress related it was felt to be a-flutter 7/17  . Mild aortic insufficiency   . Mild mitral regurgitation   . Premature atrial contractions   . PVC's (premature ventricular contractions)   . Typical atrial flutter (HCC)    a. reverse typical atrial flutter s/p ablation 08/2016.    Past Surgical History:  Procedure Laterality Date  . ABLATION OF DYSRHYTHMIC FOCUS  08/22/2016  . ELECTROPHYSIOLOGIC STUDY N/A 08/22/2016   Procedure: A-Flutter Ablation;  Surgeon: [name], MD;  Location: [facility];  Service: Cardiovascular;  Laterality: N/A;  . TRICEPS TENDON REPAIR  08/2013   Dr. [name]    Current Medications: Current Outpatient Prescriptions  Medication Sig Dispense Refill  . cholecalciferol (VITAMIN D) 1000 units tablet Take 2,000 Units by mouth daily.    . Coenzyme Q10 200 MG capsule Take 200 mg by mouth daily.     . Multiple Vitamins-Minerals (MULTIVITAMIN ADULT PO) Take 1 tablet by mouth daily. Centrum Firefighter for men    . rosuvastatin (CRESTOR) 40 MG tablet Take 20 mg by mouth at bedtime.     . vitamin C (ASCORBIC ACID) 500 MG tablet Take 500  mg by mouth daily. Seasonal: WINTER     No current facility-administered medications for this visit.      Allergies:   Patient has no known allergies.   Social History   Social History  . Marital status: Married    Spouse name: N/A  . Number of children: 2  . Years of education: college   Occupational History  . 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    Social History Main Topics  . Smoking status: Never Smoker  . Smokeless tobacco: Former Systems developer  . Alcohol use No  . Drug use: No  . Sexual activity: Not  Asked   Other Topics Concern  . 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 He lives with his 2nd wife of 30+ years (prior marriage lasted 3 years) and has 2 children. Patient denies history of alcohol and tobacco use. [employer] and [employer] Parks children. Austin Parks is peds resident at [facility] as of 2011) retired 2/12. In 2016 still working some. Part time job at [employer] and [employer].     Family History:  Family History  Problem Relation Age of Onset  . Lung cancer Mother   . Brain cancer Mother   . Stomach cancer Father   . Diabetes Mellitus II Paternal Aunt   . Diabetes Mellitus I Daughter     ROS:   Please see the history of present illness.  All other systems are reviewed and otherwise negative.    PHYSICAL EXAM:   VS:  BP 102/68   Pulse 64   Ht 6\' 3"  (1.905 m)   Wt 216 lb 12.8 oz (98.3 kg)   SpO2 97%   BMI 27.10 kg/m   BMI: Body mass index is 27.1 kg/m. GEN: Well nourished, well developed WM, in no acute distress  HEENT: normocephalic, atraumatic Neck: no JVD, carotid bruits, or masses Cardiac: RRR; no murmurs, rubs, or gallops, no edema  Respiratory:  clear to auscultation bilaterally, normal work of breathing GI: soft, nontender, nondistended, + BS MS: no deformity or atrophy  Skin: warm and dry, no rash Neuro:  Alert and Oriented x 3, Strength and sensation are intact, follows commands Psych: euthymic mood, full affect  Wt Readings from Last 3 Encounters:  05/29/17 216 lb 12.8 oz (98.3 kg)  04/28/17 217 lb 4 oz (98.5 kg)  12/26/16 223 lb 12 oz (101.5 kg)      Studies/Labs Reviewed:   EKG:  EKG was ordered today and personally reviewed by me and demonstrates NSR 62bpm, no acute ST-T changes  Recent Labs: 04/28/2017: BUN 15; Creatinine, Ser 1.11; Hemoglobin 15.3; Magnesium 2.1; Platelets 255; Potassium 5.0; Sodium 140; TSH 3.050   Lipid Panel No results found  for: CHOL, TRIG, HDL, CHOLHDL, VLDL, LDLCALC, LDLDIRECT  Additional studies/ records that were reviewed today include: Summarized above    ASSESSMENT & PLAN:   1. Tachycardia - monitor results as above. No pathologic arrhythmias to correlate - the two diary events he submitted for watch showing high HR correlated with NSR. He did have brief atrial tach. We discussed Kardia band today. His concern is that he is not always wearing his watch and takes it off frequently for physical activity and sleeping. Unclear if ILR is indicated as CHADSVASC is only 1 for age so would not necessarily warrant anticoagulation at this time even if recurrent arrhythmias were found. I will  plan to review further with Dr. Caryl Comes. 2. History of atrial flutter - see above. Discussed continued surveillance with manual pulse checks to correlate with any unusual Apple watch findings. 3. PVCs/PACs/Atrial tach - asymptomatic. Continue surveillance for symptoms. Recent lab workup unremarkable. Remains very active.  Disposition: F/u with Dr. Caryl Comes in 6 months.   Medication Adjustments/Labs and Tests Ordered: Current medicines are reviewed at length with the patient today.  Concerns regarding medicines are outlined above. Medication changes, Labs and Tests ordered today are summarized above and listed in the Patient Instructions accessible in Encounters.   Signed, Charlie Pitter, PA-C  05/29/2017 9:36 AM    [facility] [place], [place], [place]  [address] Phone: [phone]; Fax: [phone]

## 2017-05-29 ENCOUNTER — Telehealth: Payer: Self-pay | Admitting: Physician Assistant

## 2017-05-29 ENCOUNTER — Ambulatory Visit (INDEPENDENT_AMBULATORY_CARE_PROVIDER_SITE_OTHER): Payer: PPO | Admitting: Physician Assistant

## 2017-05-29 ENCOUNTER — Encounter: Payer: Self-pay | Admitting: Physician Assistant

## 2017-05-29 VITALS — BP 102/68 | HR 64 | Ht 75.0 in | Wt 216.8 lb

## 2017-05-29 DIAGNOSIS — I491 Atrial premature depolarization: Secondary | ICD-10-CM | POA: Diagnosis not present

## 2017-05-29 DIAGNOSIS — R000 Tachycardia, unspecified: Secondary | ICD-10-CM

## 2017-05-29 DIAGNOSIS — I471 Supraventricular tachycardia: Secondary | ICD-10-CM | POA: Diagnosis not present

## 2017-05-29 DIAGNOSIS — I493 Ventricular premature depolarization: Secondary | ICD-10-CM

## 2017-05-29 DIAGNOSIS — I4892 Unspecified atrial flutter: Secondary | ICD-10-CM

## 2017-05-29 NOTE — Telephone Encounter (Signed)
Please call patient. Reviewed his case with Dr. Caryl Comes - he does not feel the loop recorder would be indicated at present time since we would not really do anything with this information since a) he is asymptomatic and b) he would not require any blood thinners even if recurrent atrial flutter were found. Since the two diary events on his monitor correlated only with normal sinus rhythm, we would recommend to follow for now. Dr. Caryl Comes thinks the Kardia band still may be helpful for identifying any recurrent atrial fib/flutter but it is not absolutely necessary. F/u 6 months as discussed. Josy Peaden PA-C

## 2017-05-29 NOTE — Patient Instructions (Signed)
Medication Instructions:  Your physician recommends that you continue on your current medications as directed. Please refer to the Current Medication list given to you today.   Labwork: -None  Testing/Procedures: -None  Follow-Up: Your physician wants you to follow-up in: 6 months with Dr. Caryl Comes.  You will receive a reminder letter in the mail two months in advance. If you don't receive a letter, please call our office to schedule the follow-up appointment.   Any Other Special Instructions Will Be Listed Below (If Applicable).     If you need a refill on your cardiac medications before your next appointment, please call your pharmacy.

## 2017-06-01 ENCOUNTER — Telehealth: Payer: Self-pay | Admitting: *Deleted

## 2017-06-01 NOTE — Telephone Encounter (Signed)
S/w wife pt just stepped out  Will call back in awhile.

## 2017-06-01 NOTE — Telephone Encounter (Signed)
S/w pt is aware of Dr. Olin Pia recommendation's.

## 2017-07-16 ENCOUNTER — Telehealth: Payer: Self-pay | Admitting: Physician Assistant

## 2017-07-16 ENCOUNTER — Other Ambulatory Visit: Payer: Self-pay | Admitting: Pharmacy Technician

## 2017-07-16 NOTE — Patient Outreach (Signed)
[facility]) Care Management  07/16/2017  Lucien Budney [facility] 09/19/1946 [id number]   I'm working on medication adherence for [employer] and noticed that this prescription is written for Rosuvastatin 40 mg but the patient is prescribed to take 20 mg in  Epic. I contacted his pharmacy to verify the directions which they stated are written as one tablet daily of 40 mg. I contacted Dayna Dunn's office to have this information verified and to also request that if the patient is taking 20 mg daily that she either change medication to Rosuvastatin 20 mg daily or continue to dispense 40 mg with correct directions of 1/2 tablet daily.  Doreene Burke, [place] [phone]

## 2017-07-16 NOTE — Telephone Encounter (Signed)
Per pt pmd is ordering md for med  Disregard message per pt ./cy

## 2017-07-16 NOTE — Telephone Encounter (Signed)
[facility]) is calling about Rosuvastatin 40mg  and in Epic it shows that he should be taking 20 mg and His Pharmacy has been filling it as 40mg . Please Clarify .Marland Kitchen Please call . Thanks

## 2017-08-20 DIAGNOSIS — Z125 Encounter for screening for malignant neoplasm of prostate: Secondary | ICD-10-CM | POA: Diagnosis not present

## 2017-08-20 DIAGNOSIS — R7301 Impaired fasting glucose: Secondary | ICD-10-CM | POA: Diagnosis not present

## 2017-08-20 DIAGNOSIS — E784 Other hyperlipidemia: Secondary | ICD-10-CM | POA: Diagnosis not present

## 2017-08-27 DIAGNOSIS — I4892 Unspecified atrial flutter: Secondary | ICD-10-CM | POA: Diagnosis not present

## 2017-08-27 DIAGNOSIS — R7301 Impaired fasting glucose: Secondary | ICD-10-CM | POA: Diagnosis not present

## 2017-08-27 DIAGNOSIS — H43399 Other vitreous opacities, unspecified eye: Secondary | ICD-10-CM | POA: Diagnosis not present

## 2017-08-27 DIAGNOSIS — R001 Bradycardia, unspecified: Secondary | ICD-10-CM | POA: Diagnosis not present

## 2017-08-27 DIAGNOSIS — Z1389 Encounter for screening for other disorder: Secondary | ICD-10-CM | POA: Diagnosis not present

## 2017-08-27 DIAGNOSIS — F418 Other specified anxiety disorders: Secondary | ICD-10-CM | POA: Diagnosis not present

## 2017-08-27 DIAGNOSIS — N401 Enlarged prostate with lower urinary tract symptoms: Secondary | ICD-10-CM | POA: Diagnosis not present

## 2017-08-27 DIAGNOSIS — N528 Other male erectile dysfunction: Secondary | ICD-10-CM | POA: Diagnosis not present

## 2017-08-27 DIAGNOSIS — Z0000 Encounter for general adult medical examination without abnormal findings: Secondary | ICD-10-CM | POA: Diagnosis not present

## 2017-08-27 DIAGNOSIS — E784 Other hyperlipidemia: Secondary | ICD-10-CM | POA: Diagnosis not present

## 2017-08-27 DIAGNOSIS — M5489 Other dorsalgia: Secondary | ICD-10-CM | POA: Diagnosis not present

## 2017-08-27 DIAGNOSIS — Z6827 Body mass index (BMI) 27.0-27.9, adult: Secondary | ICD-10-CM | POA: Diagnosis not present

## 2017-09-04 DIAGNOSIS — Z1212 Encounter for screening for malignant neoplasm of rectum: Secondary | ICD-10-CM | POA: Diagnosis not present

## 2017-10-03 DIAGNOSIS — Z23 Encounter for immunization: Secondary | ICD-10-CM | POA: Diagnosis not present

## 2017-11-03 ENCOUNTER — Encounter: Payer: Self-pay | Admitting: Internal Medicine

## 2017-11-10 ENCOUNTER — Telehealth: Payer: Self-pay | Admitting: Internal Medicine

## 2017-12-04 ENCOUNTER — Encounter: Payer: Self-pay | Admitting: Internal Medicine

## 2017-12-04 NOTE — Telephone Encounter (Signed)
Dr. Henrene Pastor reviewed records and has accepted patient. Ok to schedule Office visit. Appointment scheduled.

## 2017-12-25 ENCOUNTER — Ambulatory Visit: Payer: PPO | Admitting: Internal Medicine

## 2017-12-25 ENCOUNTER — Encounter: Payer: Self-pay | Admitting: Internal Medicine

## 2017-12-25 VITALS — BP 126/84 | HR 72 | Ht 75.0 in | Wt 219.0 lb

## 2017-12-25 DIAGNOSIS — I4892 Unspecified atrial flutter: Secondary | ICD-10-CM

## 2017-12-25 DIAGNOSIS — I493 Ventricular premature depolarization: Secondary | ICD-10-CM | POA: Diagnosis not present

## 2017-12-25 DIAGNOSIS — R000 Tachycardia, unspecified: Secondary | ICD-10-CM | POA: Diagnosis not present

## 2017-12-25 NOTE — Patient Instructions (Signed)
Medication Instructions: Your physician recommends that you continue on your current medications as directed. Please refer to the Current Medication list given to you today.  Labwork: None Ordered  Procedures/Testing: None Ordered  Follow-Up: Your physician wants you to follow-up in 1 YEAR with Dr. Klein. You will receive a reminder letter in the mail two months in advance. If you don't receive a letter, please call our office to schedule the follow-up appointment.   If you need a refill on your cardiac medications before your next appointment, please call your pharmacy.   

## 2017-12-25 NOTE — Progress Notes (Signed)
Patient Care Team: [name], MD as PCP - General (Internal Medicine)   HPI  Austin Parks is a 72 y.o. male Seen with a chief complaint of atrial flutter-atypical for which she underwent EP testing and catheter ablation 9/17.  At that time, reverse typical flutter was identified.  He was seen May 2018 with apple watch recordings of tachycardia.  He underwent Holter monitoring-personally reviewed-demonstrating nonsustained atrial tachycardia and PVCs.  His apple watch continues to identify heart rates up to 190; on the hourly spectrum none of them lasts more than that.  There are no associated symptoms.  He is taking aspirin once a week.  Is on statin therapy.     Past Medical History:  Diagnosis Date  . Atrial tachycardia ([place])   . Back pain, chronic   . Benign prostatic hypertrophy    with obstruction  . Bradycardia    ectopic  . Erectile dysfunction of organic origin   . Gastroesophageal reflux disease   . Hypercholesterolemia    borderline. admitted january 2011 for chest pain-all stress related it was felt to be a-flutter 7/17  . Mild aortic insufficiency   . Mild mitral regurgitation   . Premature atrial contractions   . PVC's (premature ventricular contractions)   . Typical atrial flutter ([facility])    a. reverse typical atrial flutter s/p ablation 08/2016.    Past Surgical History:  Procedure Laterality Date  . ABLATION OF DYSRHYTHMIC FOCUS  08/22/2016  . ELECTROPHYSIOLOGIC STUDY N/A 08/22/2016   Procedure: A-Flutter Ablation;  Surgeon: [name], MD;  Location: [facility];  Service: Cardiovascular;  Laterality: N/A;  . TRICEPS TENDON REPAIR  08/2013   Dr. [name]    Current Outpatient Medications  Medication Sig Dispense Refill  . cholecalciferol (VITAMIN D) 1000 units tablet Take 2,000 Units by mouth daily.    . Coenzyme Q10 200 MG capsule Take 200 mg by mouth daily.     . Multiple Vitamins-Minerals (MULTIVITAMIN ADULT PO) Take 1  tablet by mouth daily. Centrum Firefighter for men    . rosuvastatin (CRESTOR) 40 MG tablet Take 20 mg by mouth at bedtime.     . vitamin C (ASCORBIC ACID) 500 MG tablet Take 500 mg by mouth daily. Seasonal: WINTER     No current facility-administered medications for this visit.     No Known Allergies    Review of Systems negative except from HPI and PMH  Physical Exam BP 126/84   Pulse 72   Ht 6\' 3"  (1.905 m)   Wt 219 lb (99.3 kg)   BMI 27.37 kg/m  Well developed and nourished in no acute distress HENT normal Neck supple with JVP-flat Carotids brisk and full without bruits Clear Regular rate and rhythm, no murmurs or gallops Abd-soft with active BS without hepatomegaly No Clubbing cyanosis edema Skin-warm and dry A & Oriented  Grossly normal sensory and motor function  ECG demonstrates sinus rhythm at 72 Intervals 13/09/37   Assessment and  Plan  Atrial flutter-regards typical-status post ablation  Atrial tachycardia-nonsustained  Hyperlipidemia on statin therapy   We have reviewed the aspirin data.  There are no data of which I am aware looking at the risk benefits of once a week.  I would not utilize aspirin daily based either on his own bleeding as well as the recent studies.  Given his hyperlipidemia, I have asked him to review with Dr. Haynes Kerns the role of calcium scoring for further risk  stratification.  If he were in the group of a score of 0, recommendations would now be that he did not need statin therapy  He will reach out to Dr. Joylene Draft  and I will forward this information.  We spent more than 50% of our >25 min visit in face to face counseling regarding the above        Current medicines are reviewed at length with the patient today .  The patient does not  have concerns regarding medicines.

## 2018-01-09 ENCOUNTER — Other Ambulatory Visit: Payer: Self-pay | Admitting: Internal Medicine

## 2018-01-09 DIAGNOSIS — E785 Hyperlipidemia, unspecified: Secondary | ICD-10-CM

## 2018-01-15 ENCOUNTER — Ambulatory Visit
Admission: RE | Admit: 2018-01-15 | Discharge: 2018-01-15 | Disposition: A | Discharge status: Home / Self Care | Payer: PPO | Source: Ambulatory Visit | Attending: Internal Medicine | Admitting: Internal Medicine

## 2018-01-15 DIAGNOSIS — E785 Hyperlipidemia, unspecified: Secondary | ICD-10-CM

## 2018-01-15 DIAGNOSIS — E7800 Pure hypercholesterolemia, unspecified: Secondary | ICD-10-CM | POA: Diagnosis not present

## 2018-01-20 ENCOUNTER — Encounter: Payer: Self-pay | Admitting: Internal Medicine

## 2018-01-20 ENCOUNTER — Ambulatory Visit: Payer: PPO | Admitting: Internal Medicine

## 2018-01-20 VITALS — BP 128/90 | HR 80 | Ht 75.0 in | Wt 221.0 lb

## 2018-01-20 DIAGNOSIS — K5730 Diverticulosis of large intestine without perforation or abscess without bleeding: Secondary | ICD-10-CM

## 2018-01-20 DIAGNOSIS — Z1211 Encounter for screening for malignant neoplasm of colon: Secondary | ICD-10-CM | POA: Diagnosis not present

## 2018-01-20 NOTE — Patient Instructions (Signed)
If you are age 73 or older, your body mass index should be between 23-30. Your Body mass index is 27.62 kg/m. If this is out of the aforementioned range listed, please consider follow up with your Primary Care Provider.  If you are age 84 or younger, your body mass index should be between 19-25. Your Body mass index is 27.62 kg/m. If this is out of the aformentioned range listed, please consider follow up with your Primary Care Provider.   You will be due for a colonoscopy in April 2021.  We will call you when the time approaches to schedule it.  Thank you, [facility]

## 2018-01-21 ENCOUNTER — Encounter: Payer: Self-pay | Admitting: Internal Medicine

## 2018-01-21 NOTE — Progress Notes (Signed)
HISTORY OF PRESENT ILLNESS:  Austin Parks is a 72 y.o. male who presents today to discuss previous colonoscopy findings and the need for screening colonoscopy. Outside records from Dr. Sonia Baller have been reviewed. As well outside laboratories and x-rays. In summary, the patient had colonoscopy in April 2005 and again in April 2011 and was found to have diverticulosis. No polyps. No family history of colon cancer. Upper endoscopy in 2002 revealed duodenitis associated with Helicobacter pylori for which she was treated with Prevpac. Patient's GI review of systems is remarkable for occasional acid reflux and bloating. No other symptoms. No difficulties with his bowels or bleeding. Previous examinations were completely with excellent preparation. Laboratories from May 2018 were unremarkable including CBC with hemoglobin 15.3. He has questions regarding diverticulosis.  REVIEW OF SYSTEMS:  All non-GI ROS negative unless otherwise stated in the history of present illness except for back pain  Past Medical History:  Diagnosis Date  . Atrial tachycardia ([place])   . Back pain, chronic   . Benign prostatic hypertrophy    with obstruction  . Bradycardia    ectopic  . Erectile dysfunction of organic origin   . Gastroesophageal reflux disease   . Hypercholesterolemia    borderline. admitted january 2011 for chest pain-all stress related it was felt to be a-flutter 7/17  . Mild aortic insufficiency   . Mild mitral regurgitation   . Premature atrial contractions   . PVC's (premature ventricular contractions)   . Typical atrial flutter (H[facility])    a. reverse typical atrial flutter s/p ablation 08/2016.    Past Surgical History:  Procedure Laterality Date  . ABLATION OF DYSRHYTHMIC FOCUS  08/22/2016  . ELECTROPHYSIOLOGIC STUDY N/A 08/22/2016   Procedure: A-Flutter Ablation;  Surgeon: [name], MD;  Location: [facility];  Service: Cardiovascular;  Laterality: N/A;  . TRICEPS TENDON  REPAIR  08/2013   Dr. [name]    Social History Kathaleen Bury Shaul  reports that  has never smoked. He has quit using smokeless tobacco. He reports that he does not drink alcohol or use drugs.  family history includes Brain cancer in his mother; Diabetes Mellitus I in his daughter; Diabetes Mellitus II in his paternal aunt; Lung cancer in his mother; Stomach cancer in his father.  No Known Allergies     PHYSICAL EXAMINATION: Vital signs: BP 128/90   Pulse 80   Ht 6\' 3"  (1.905 m)   Wt 220 lb 15.7 oz (100.2 kg)   BMI 27.62 kg/m   Constitutional: generally well-appearing, no acute distress Psychiatric: alert and oriented x3, cooperative Eyes: extraocular movements intact, anicteric, conjunctiva pink Mouth: oral pharynx moist, no lesions Neck: supple no lymphadenopathy Cardiovascular: heart regular rate and rhythm, no murmur Lungs: clear to auscultation bilaterally Abdomen: soft, nontender, nondistended, no obvious ascites, no peritoneal signs, normal bowel sounds, no organomegaly Rectal:omitted Extremities: no lower extremity edema bilaterally Skin: no lesions on visible extremities Neuro: No focal deficits.   ASSESSMENT:  #1. Colonic diverticulosis. #2. Colon cancer screening. Negative examinations 2005 and 2011. Baseline risk #3. History of Helicobacter pylori duodenitis status post treatment with Prevpac   PLAN:  #1. Discussion on diverticular disease #2. Recommend high-fiber diet #3. Literature on diverticular disease provided for his review #4. Appropriate time for follow-up screening colonoscopy would be 10 years from his last examination based on the quality of his previous examinations, the results, and current guidelines. A recall has been placed into the system by the assisting CMA today. Certainly,  for relevance clinical signs or symptoms interval colonoscopy might be appropriate

## 2018-01-29 ENCOUNTER — Telehealth: Payer: Self-pay

## 2018-01-29 DIAGNOSIS — R05 Cough: Secondary | ICD-10-CM | POA: Diagnosis not present

## 2018-01-29 NOTE — Telephone Encounter (Signed)
Received CT report from [facility]. Sent to Provider.

## 2018-09-18 DIAGNOSIS — Z23 Encounter for immunization: Secondary | ICD-10-CM | POA: Diagnosis not present

## 2018-09-28 DIAGNOSIS — R82998 Other abnormal findings in urine: Secondary | ICD-10-CM | POA: Diagnosis not present

## 2018-09-28 DIAGNOSIS — E7849 Other hyperlipidemia: Secondary | ICD-10-CM | POA: Diagnosis not present

## 2018-09-28 DIAGNOSIS — R7301 Impaired fasting glucose: Secondary | ICD-10-CM | POA: Diagnosis not present

## 2018-09-28 DIAGNOSIS — Z125 Encounter for screening for malignant neoplasm of prostate: Secondary | ICD-10-CM | POA: Diagnosis not present

## 2018-10-05 DIAGNOSIS — I4892 Unspecified atrial flutter: Secondary | ICD-10-CM | POA: Diagnosis not present

## 2018-10-05 DIAGNOSIS — R600 Localized edema: Secondary | ICD-10-CM | POA: Diagnosis not present

## 2018-10-05 DIAGNOSIS — M5489 Other dorsalgia: Secondary | ICD-10-CM | POA: Diagnosis not present

## 2018-10-05 DIAGNOSIS — Z0000 Encounter for general adult medical examination without abnormal findings: Secondary | ICD-10-CM | POA: Diagnosis not present

## 2018-10-05 DIAGNOSIS — N400 Enlarged prostate without lower urinary tract symptoms: Secondary | ICD-10-CM | POA: Diagnosis not present

## 2018-10-05 DIAGNOSIS — I498 Other specified cardiac arrhythmias: Secondary | ICD-10-CM | POA: Diagnosis not present

## 2018-10-05 DIAGNOSIS — I2510 Atherosclerotic heart disease of native coronary artery without angina pectoris: Secondary | ICD-10-CM | POA: Diagnosis not present

## 2018-10-05 DIAGNOSIS — F418 Other specified anxiety disorders: Secondary | ICD-10-CM | POA: Diagnosis not present

## 2018-10-05 DIAGNOSIS — Z1389 Encounter for screening for other disorder: Secondary | ICD-10-CM | POA: Diagnosis not present

## 2018-10-05 DIAGNOSIS — Z6827 Body mass index (BMI) 27.0-27.9, adult: Secondary | ICD-10-CM | POA: Diagnosis not present

## 2018-10-05 DIAGNOSIS — R7301 Impaired fasting glucose: Secondary | ICD-10-CM | POA: Diagnosis not present

## 2018-10-07 DIAGNOSIS — Z1212 Encounter for screening for malignant neoplasm of rectum: Secondary | ICD-10-CM | POA: Diagnosis not present

## 2019-01-05 DIAGNOSIS — J09X2 Influenza due to identified novel influenza A virus with other respiratory manifestations: Secondary | ICD-10-CM | POA: Diagnosis not present

## 2019-01-05 DIAGNOSIS — R05 Cough: Secondary | ICD-10-CM | POA: Diagnosis not present

## 2019-01-05 DIAGNOSIS — Z6827 Body mass index (BMI) 27.0-27.9, adult: Secondary | ICD-10-CM | POA: Diagnosis not present

## 2019-01-06 ENCOUNTER — Ambulatory Visit: Payer: PPO | Admitting: Internal Medicine

## 2019-01-25 ENCOUNTER — Ambulatory Visit: Payer: PPO | Admitting: Internal Medicine

## 2019-01-25 VITALS — BP 124/82 | HR 73 | Resp 15 | Ht 75.0 in | Wt 215.0 lb

## 2019-01-25 DIAGNOSIS — I493 Ventricular premature depolarization: Secondary | ICD-10-CM

## 2019-01-25 DIAGNOSIS — I4892 Unspecified atrial flutter: Secondary | ICD-10-CM | POA: Diagnosis not present

## 2019-01-25 DIAGNOSIS — H2513 Age-related nuclear cataract, bilateral: Secondary | ICD-10-CM | POA: Diagnosis not present

## 2019-01-25 DIAGNOSIS — I491 Atrial premature depolarization: Secondary | ICD-10-CM

## 2019-01-25 NOTE — Patient Instructions (Signed)

## 2019-01-25 NOTE — Progress Notes (Signed)
      Patient Care Team: [name], MD as PCP - General (Internal Medicine)   HPI  Austin Parks is a 73 y.o. male Seen with a chief complaint of atrial flutter-atypical for which he underwent EP testing and catheter ablation 9/17.  At that time, reverse typical flutter was identified.  He was seen May 2018 with apple watch recordings of tachycardia.  He underwent Holter monitoring-personally reviewed-demonstrating nonsustained atrial tachycardia and PVCs.  He has few palpitations but overall is doing exceptionally well without chest pain or sob  He is taking aspirin  week.  Is on statin therapy.     Past Medical History:  Diagnosis Date  . Atrial tachycardia ([place])   . Back pain, chronic   . Benign prostatic hypertrophy    with obstruction  . Bradycardia    ectopic  . Erectile dysfunction of organic origin   . Gastroesophageal reflux disease   . Hypercholesterolemia    borderline. admitted january 2011 for chest pain-all stress related it was felt to be a-flutter 7/17  . Mild aortic insufficiency   . Mild mitral regurgitation   . Premature atrial contractions   . PVC's (premature ventricular contractions)   . Typical atrial flutter ([facility])    a. reverse typical atrial flutter s/p ablation 08/2016.    Past Surgical History:  Procedure Laterality Date  . ABLATION OF DYSRHYTHMIC FOCUS  08/22/2016  . ELECTROPHYSIOLOGIC STUDY N/A 08/22/2016   Procedure: A-Flutter Ablation;  Surgeon: [name], MD;  Location: [facility];  Service: Cardiovascular;  Laterality: N/A;  . TRICEPS TENDON REPAIR  08/2013   Dr. [name]    Current Outpatient Medications  Medication Sig Dispense Refill  . aspirin EC 81 MG tablet Take 81 mg by mouth daily.    . cholecalciferol (VITAMIN D) 1000 units tablet Take 2,000 Units by mouth daily.    . Coenzyme Q10 200 MG capsule Take 200 mg by mouth daily.     . Multiple Vitamins-Minerals (MULTIVITAMIN ADULT PO) Take 1 tablet by mouth  daily. [employer]rum Firefighter for men    . Omega-3 Fatty Acids (FISH OIL) 1000 MG CAPS Take 1 capsule by mouth every other day.    . rosuvastatin (CRESTOR) 40 MG tablet Take 20 mg by mouth at bedtime.      No current facility-administered medications for this visit.     No Known Allergies    Review of Systems negative except from HPI and PMH  Physical Exam BP 124/82   Pulse 73   Resp 15   Ht 6\' 3"  (1.905 m)   Wt 215 lb (97.5 kg)   BMI 26.87 kg/m  Well developed and nourished in no acute distress HENT normal Neck supple with JVP-flat Clear Regular rate and rhythm, no murmurs or gallops Abd-soft with active BS No Clubbing cyanosis edema Skin-warm and dry A & Oriented  Grossly normal sensory and motor function  ECG demonstrates sinus @ 73 25/08/38  Assessment and  Plan  Atrial flutter-regards typical-status post ablation  Atrial tachycardia-nonsustained  Hyperlipidemia on statin therapy   No significant arrhythmia  Continue current meds         Current medicines are reviewed at length with the patient today .  The patient does not  have concerns regarding medicines.

## 2019-08-23 DIAGNOSIS — M25562 Pain in left knee: Secondary | ICD-10-CM | POA: Diagnosis not present

## 2019-08-23 DIAGNOSIS — M25561 Pain in right knee: Secondary | ICD-10-CM | POA: Diagnosis not present

## 2019-09-16 DIAGNOSIS — Z23 Encounter for immunization: Secondary | ICD-10-CM | POA: Diagnosis not present

## 2019-10-11 DIAGNOSIS — Z23 Encounter for immunization: Secondary | ICD-10-CM | POA: Diagnosis not present

## 2019-10-11 DIAGNOSIS — S81812A Laceration without foreign body, left lower leg, initial encounter: Secondary | ICD-10-CM | POA: Diagnosis not present

## 2019-11-03 DIAGNOSIS — R7301 Impaired fasting glucose: Secondary | ICD-10-CM | POA: Diagnosis not present

## 2019-11-03 DIAGNOSIS — Z125 Encounter for screening for malignant neoplasm of prostate: Secondary | ICD-10-CM | POA: Diagnosis not present

## 2019-11-03 DIAGNOSIS — E7849 Other hyperlipidemia: Secondary | ICD-10-CM | POA: Diagnosis not present

## 2019-11-09 DIAGNOSIS — Z1331 Encounter for screening for depression: Secondary | ICD-10-CM | POA: Diagnosis not present

## 2019-11-09 DIAGNOSIS — R82998 Other abnormal findings in urine: Secondary | ICD-10-CM | POA: Diagnosis not present

## 2019-11-09 DIAGNOSIS — R7301 Impaired fasting glucose: Secondary | ICD-10-CM | POA: Diagnosis not present

## 2019-11-09 DIAGNOSIS — R001 Bradycardia, unspecified: Secondary | ICD-10-CM | POA: Diagnosis not present

## 2019-11-09 DIAGNOSIS — F419 Anxiety disorder, unspecified: Secondary | ICD-10-CM | POA: Diagnosis not present

## 2019-11-09 DIAGNOSIS — N529 Male erectile dysfunction, unspecified: Secondary | ICD-10-CM | POA: Diagnosis not present

## 2019-11-09 DIAGNOSIS — E785 Hyperlipidemia, unspecified: Secondary | ICD-10-CM | POA: Diagnosis not present

## 2019-11-09 DIAGNOSIS — I2510 Atherosclerotic heart disease of native coronary artery without angina pectoris: Secondary | ICD-10-CM | POA: Diagnosis not present

## 2019-11-09 DIAGNOSIS — N401 Enlarged prostate with lower urinary tract symptoms: Secondary | ICD-10-CM | POA: Diagnosis not present

## 2019-11-09 DIAGNOSIS — H43399 Other vitreous opacities, unspecified eye: Secondary | ICD-10-CM | POA: Diagnosis not present

## 2019-11-09 DIAGNOSIS — I499 Cardiac arrhythmia, unspecified: Secondary | ICD-10-CM | POA: Diagnosis not present

## 2019-11-09 DIAGNOSIS — M5489 Other dorsalgia: Secondary | ICD-10-CM | POA: Diagnosis not present

## 2019-11-09 DIAGNOSIS — Z0000 Encounter for general adult medical examination without abnormal findings: Secondary | ICD-10-CM | POA: Diagnosis not present

## 2019-11-09 DIAGNOSIS — I4892 Unspecified atrial flutter: Secondary | ICD-10-CM | POA: Diagnosis not present

## 2019-12-06 DIAGNOSIS — Z1212 Encounter for screening for malignant neoplasm of rectum: Secondary | ICD-10-CM | POA: Diagnosis not present

## 2020-01-17 ENCOUNTER — Ambulatory Visit: Payer: PPO

## 2020-01-26 ENCOUNTER — Ambulatory Visit: Payer: PPO

## 2020-02-10 ENCOUNTER — Ambulatory Visit: Payer: PPO

## 2020-02-18 DIAGNOSIS — I471 Supraventricular tachycardia: Secondary | ICD-10-CM | POA: Insufficient documentation

## 2020-02-21 DIAGNOSIS — Z8679 Personal history of other diseases of the circulatory system: Secondary | ICD-10-CM | POA: Insufficient documentation

## 2020-02-23 ENCOUNTER — Ambulatory Visit: Payer: PPO | Admitting: Internal Medicine

## 2020-04-02 ENCOUNTER — Other Ambulatory Visit: Payer: Self-pay

## 2020-04-02 ENCOUNTER — Encounter: Payer: Self-pay | Admitting: Internal Medicine

## 2020-04-02 ENCOUNTER — Ambulatory Visit: Payer: PPO | Admitting: Internal Medicine

## 2020-04-02 VITALS — BP 130/88 | HR 66 | Ht 75.0 in | Wt 210.0 lb

## 2020-04-02 DIAGNOSIS — I4892 Unspecified atrial flutter: Secondary | ICD-10-CM | POA: Diagnosis not present

## 2020-04-02 DIAGNOSIS — Z9889 Other specified postprocedural states: Secondary | ICD-10-CM

## 2020-04-02 DIAGNOSIS — Z8679 Personal history of other diseases of the circulatory system: Secondary | ICD-10-CM

## 2020-04-02 DIAGNOSIS — E7849 Other hyperlipidemia: Secondary | ICD-10-CM | POA: Diagnosis not present

## 2020-04-02 NOTE — Patient Instructions (Signed)
Medication Instructions:  Your physician recommends that you continue on your current medications as directed. Please refer to the Current Medication list given to you today.  Labwork: None ordered.  Testing/Procedures: None ordered.  Follow-Up: Your physician wants you to follow-up in:12 months  You will receive a reminder letter in the mail two months in advance. If you don't receive a letter, please call our office to schedule the follow-up appointment.  Any Other Special Instructions Will Be Listed Below (If Applicable).  If you need a refill on your cardiac medications before your next appointment, please call your pharmacy.

## 2020-04-02 NOTE — Progress Notes (Signed)
Patient Care Team: [name], MD as PCP - General (Internal Medicine)   HPI  Austin Parks is a 74 y.o. male Seen with a chief complaint of atrial flutter-atypical for which he underwent EP testing and catheter ablation 9/17.  At that time, reverse typical flutter was identified.  He was seen May 2018 with apple watch recordings of tachycardia.  He underwent Holter monitoring-personally reviewed-demonstrating nonsustained atrial tachycardia and PVCs.  No palpitations.  But does have rapid heart rates recorded on his watch.  The patient denies chest pain, shortness of breath, nocturnal dyspnea, orthopnea or peripheral edema.  There have been no lightheadedness or syncope.     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     Past Medical History:  Diagnosis Date  . Atrial tachycardia ([place])   . Back pain, chronic   . Benign prostatic hypertrophy    with obstruction  . Bradycardia    ectopic  . Erectile dysfunction of organic origin   . Gastroesophageal reflux disease   . Hypercholesterolemia    borderline. admitted january 2011 for chest pain-all stress related it was felt to be a-flutter 7/17  . Mild aortic insufficiency   . Mild mitral regurgitation   . Premature atrial contractions   . PVC's (premature ventricular contractions)   . Typical atrial flutter ([facility])    a. reverse typical atrial flutter s/p ablation 08/2016.    Past Surgical History:  Procedure Laterality Date  . ABLATION OF DYSRHYTHMIC FOCUS  08/22/2016  . ELECTROPHYSIOLOGIC STUDY N/A 08/22/2016   Procedure: A-Flutter Ablation;  Surgeon: [name], MD;  Location: [facility];  Service: Cardiovascular;  Laterality: N/A;  . TRICEPS TENDON REPAIR  08/2013   Dr. [name]    Current Outpatient Medications  Medication Sig Dispense Refill  . aspirin EC 81 MG tablet Take 81 mg by mouth daily.    . cholecalciferol  (VITAMIN D) 1000 units tablet Take 2,000 Units by mouth daily.    . Coenzyme Q10 200 MG capsule Take 200 mg by mouth daily.     Marland Kitchen ezetimibe (ZETIA) 10 MG tablet Take 10 mg by mouth daily.    . Multiple Vitamins-Minerals (MULTIVITAMIN ADULT PO) Take 1 tablet by mouth daily. [employer]rum Firefighter for men    . Omega-3 Fatty Acids (FISH OIL) 1000 MG CAPS Take 1 capsule by mouth every other day.    . rosuvastatin (CRESTOR) 40 MG tablet Take 20 mg by mouth at bedtime.      No current facility-administered medications for this visit.    No Known Allergies    Review of Systems negative except from HPI and PMH  Physical Exam BP 130/88   Pulse 66   Ht 6\' 3"  (1.905 m)   Wt 210 lb (95.3 kg)   SpO2 98%   BMI 26.25 kg/m  Well developed and nourished in no acute distress HENT normal Neck supple with JVP-  Flat  Clear Regular rate and rhythm, no murmurs or gallops Abd-soft with active BS No Clubbing cyanosis edema Skin-warm and dry A & Oriented  Grossly normal sensory and motor function  ECG sinus at 66 Normal 22/08/39 Axis 37 RSR prime  Assessment and  Plan  Atrial flutter  typical-status post ablation  Atrial tachycardia-nonsustained  Hyperlipidemia on statin therapy  Some interval tachycardia based on his apple watch with no associated symptoms.  No recurrent sustained tachycardia  LDL 85 recently started on ezetimibe.  Dr. Haynes Kerns following         Current medicines are reviewed at length with the patient today .  The patient does not  have concerns regarding medicines.

## 2020-05-07 ENCOUNTER — Encounter: Payer: Self-pay | Admitting: Internal Medicine

## 2020-06-14 DIAGNOSIS — I788 Other diseases of capillaries: Secondary | ICD-10-CM | POA: Diagnosis not present

## 2020-06-21 DIAGNOSIS — H52203 Unspecified astigmatism, bilateral: Secondary | ICD-10-CM | POA: Diagnosis not present

## 2020-06-21 DIAGNOSIS — H5203 Hypermetropia, bilateral: Secondary | ICD-10-CM | POA: Diagnosis not present

## 2020-06-21 DIAGNOSIS — H2513 Age-related nuclear cataract, bilateral: Secondary | ICD-10-CM | POA: Diagnosis not present

## 2020-07-03 ENCOUNTER — Ambulatory Visit (AMBULATORY_SURGERY_CENTER): Payer: Self-pay

## 2020-07-03 ENCOUNTER — Other Ambulatory Visit: Payer: Self-pay

## 2020-07-03 VITALS — Ht 75.0 in | Wt 208.0 lb

## 2020-07-03 DIAGNOSIS — Z1211 Encounter for screening for malignant neoplasm of colon: Secondary | ICD-10-CM

## 2020-07-03 MED ORDER — SUTAB 1479-225-188 MG PO TABS: 1.0000 | ORAL_TABLET | ORAL | 0 refills | Status: DC

## 2020-07-03 NOTE — Progress Notes (Signed)
No egg or soy allergy known to patient  No issues with past sedation with any surgeries or procedures no intubation problems in the past  No diet pills per patient No home 02 use per patient  No blood thinners per patient  Pt denies issues with constipation  No A fib or A flutter   COVID 19 guidelines implemented in PV today   Coupon given to pt in PV today   COVID vaccine completed on 01/2020 per pt;   Due to the COVID-19 pandemic we are asking patients to follow these guidelines. Please only bring one care partner. Please be aware that your care partner may wait in the car in the parking lot or if they feel like they will be too hot to wait in the car, they may wait in the lobby on the 4th floor. All care partners are required to wear a mask the entire time (we do not have any that we can provide them), they need to practice social distancing, and we will do a Covid check for all patient's and care partners when you arrive. Also we will check their temperature and your temperature. If the care partner waits in their car they need to stay in the parking lot the entire time and we will call them on their cell phone when the patient is ready for discharge so they can bring the car to the front of the building. Also all patient's will need to wear a mask into building.

## 2020-07-04 ENCOUNTER — Encounter: Payer: Self-pay | Admitting: Internal Medicine

## 2020-07-17 ENCOUNTER — Other Ambulatory Visit: Payer: Self-pay

## 2020-07-17 ENCOUNTER — Ambulatory Visit (AMBULATORY_SURGERY_CENTER): Payer: PPO | Admitting: Internal Medicine

## 2020-07-17 ENCOUNTER — Encounter: Payer: Self-pay | Admitting: Internal Medicine

## 2020-07-17 VITALS — BP 137/72 | HR 58 | Temp 97.3°F | Resp 11 | Ht 75.0 in | Wt 208.0 lb

## 2020-07-17 DIAGNOSIS — D122 Benign neoplasm of ascending colon: Secondary | ICD-10-CM

## 2020-07-17 DIAGNOSIS — Z1211 Encounter for screening for malignant neoplasm of colon: Secondary | ICD-10-CM | POA: Diagnosis not present

## 2020-07-17 DIAGNOSIS — D490 Neoplasm of unspecified behavior of digestive system: Secondary | ICD-10-CM | POA: Diagnosis not present

## 2020-07-17 MED ORDER — SODIUM CHLORIDE 0.9 % IV SOLN: 500.0000 mL | Freq: Once | INTRAVENOUS | Status: DC

## 2020-07-17 NOTE — Op Note (Signed)
[facility] Patient Name: Austin Parks Procedure Date: 07/17/2020 11:42 AM MRN: [id number] Endoscopist: Docia Chuck. Henrene Pastor , MD Age: 74 Referring MD:  Date of Birth: [birth year] Gender: Male Account #: [id number] Procedure:                Colonoscopy with cold snare polypectomy x 1 Indications:              Screening for colorectal malignant neoplasm.                            Previous examinations elsewhere 2005 and 2011 were                            both negative for neoplasia Medicines:                Monitored Anesthesia Care Procedure:                Pre-Anesthesia Assessment:                           - Prior to the procedure, a History and Physical                            was performed, and patient medications and                            allergies were reviewed. The patient's tolerance of                            previous anesthesia was also reviewed. The risks                            and benefits of the procedure and the sedation                            options and risks were discussed with the patient.                            All questions were answered, and informed consent                            was obtained. Prior Anticoagulants: The patient has                            taken no previous anticoagulant or antiplatelet                            agents. ASA Grade Assessment: II - A patient with                            mild systemic disease. After reviewing the risks                            and benefits, the patient was deemed in  satisfactory condition to undergo the procedure.                           After obtaining informed consent, the colonoscope                            was passed under direct vision. Throughout the                            procedure, the patient's blood pressure, pulse, and                            oxygen saturations were monitored continuously. The                             Colonoscope was introduced through the anus and                            advanced to the the cecum, identified by                            appendiceal orifice and ileocecal valve. The                            ileocecal valve, appendiceal orifice, and rectum                            were photographed. The quality of the bowel                            preparation was excellent. The colonoscopy was                            performed without difficulty. The patient tolerated                            the procedure well. The bowel preparation used was                            SUPREP via split dose instruction. Scope In: 11:58:28 AM Scope Out: 12:11:12 PM Scope Withdrawal Time: 0 hours 9 minutes 47 seconds  Total Procedure Duration: 0 hours 12 minutes 44 seconds  Findings:                 A 2 mm polyp was found in the ascending colon. The                            polyp was removed with a cold snare. Resection and                            retrieval were complete.                           Multiple small and large-mouthed diverticula were  found in the left colon.                           Internal hemorrhoids were found during retroflexion.                           The exam was otherwise without abnormality on                            direct and retroflexion views. Complications:            No immediate complications. Estimated blood loss:                            None. Estimated Blood Loss:     Estimated blood loss: none. Impression:               - One 2 mm polyp in the ascending colon, removed                            with a cold snare. Resected and retrieved.                           - Diverticulosis in the left colon.                           - Internal hemorrhoids.                           - The examination was otherwise normal on direct                            and retroflexion views, based on current age,                             previous exam findings, and favorable findings on                            today's examination. Recommendation:           - Repeat colonoscopy is not recommended for                            surveillance.                           - Patient has a contact number available for                            emergencies. The signs and symptoms of potential                            delayed complications were discussed with the                            patient. Return to normal activities tomorrow.  Written discharge instructions were provided to the                            patient.                           - Resume previous diet.                           - Continue present medications.                           - Await pathology results. Docia Chuck. Henrene Pastor, MD 07/17/2020 12:16:33 PM This report has been signed electronically.

## 2020-07-17 NOTE — Patient Instructions (Signed)

## 2020-07-17 NOTE — Progress Notes (Signed)
Pt's states no medical or surgical changes since previsit or office visit.   V/S-CW  Check-in-JB 

## 2020-07-17 NOTE — Progress Notes (Signed)
Called to room to assist during endoscopic procedure.  Patient ID and intended procedure confirmed with present staff. Received instructions for my participation in the procedure from the performing physician.  

## 2020-07-17 NOTE — Progress Notes (Signed)
Report to PACU, RN, vss, BBS= Clear.  

## 2020-07-19 ENCOUNTER — Telehealth: Payer: Self-pay

## 2020-07-19 ENCOUNTER — Encounter: Payer: Self-pay | Admitting: Internal Medicine

## 2020-07-19 NOTE — Telephone Encounter (Signed)
°  Follow up Call-  Call back number 07/17/2020  Post procedure Call Back phone  # [phone]  Permission to leave phone message Yes  Some recent data might be hidden     Patient questions:  Do you have a fever, pain , or abdominal swelling? No. Pain Score  0 *  Have you tolerated food without any problems? Yes.    Have you been able to return to your normal activities? Yes.    Do you have any questions about your discharge instructions: Diet   No. Medications  No. Follow up visit  No.  Do you have questions or concerns about your Care? No.  Actions: * If pain score is 4 or above: No action needed, pain <4.   1. Have you developed a fever since your procedure? No  2.   Have you had an respiratory symptoms (SOB or cough) since your procedure? No   3.   Have you tested positive for COVID 19 since your procedure? No   4.   Have you had any family members/close contacts diagnosed with the COVID 19 since your procedure?  No    If yes to any of these questions please route to Joylene John, RN and Erenest Rasher, RN

## 2020-11-07 DIAGNOSIS — E785 Hyperlipidemia, unspecified: Secondary | ICD-10-CM | POA: Diagnosis not present

## 2020-11-07 DIAGNOSIS — Z125 Encounter for screening for malignant neoplasm of prostate: Secondary | ICD-10-CM | POA: Diagnosis not present

## 2020-11-07 DIAGNOSIS — R7301 Impaired fasting glucose: Secondary | ICD-10-CM | POA: Diagnosis not present

## 2020-11-14 DIAGNOSIS — R0789 Other chest pain: Secondary | ICD-10-CM | POA: Diagnosis not present

## 2020-11-14 DIAGNOSIS — N401 Enlarged prostate with lower urinary tract symptoms: Secondary | ICD-10-CM | POA: Diagnosis not present

## 2020-11-14 DIAGNOSIS — Z0000 Encounter for general adult medical examination without abnormal findings: Secondary | ICD-10-CM | POA: Diagnosis not present

## 2020-11-14 DIAGNOSIS — Z1212 Encounter for screening for malignant neoplasm of rectum: Secondary | ICD-10-CM | POA: Diagnosis not present

## 2020-11-14 DIAGNOSIS — Z1331 Encounter for screening for depression: Secondary | ICD-10-CM | POA: Diagnosis not present

## 2020-11-14 DIAGNOSIS — N529 Male erectile dysfunction, unspecified: Secondary | ICD-10-CM | POA: Diagnosis not present

## 2020-11-14 DIAGNOSIS — E785 Hyperlipidemia, unspecified: Secondary | ICD-10-CM | POA: Diagnosis not present

## 2020-11-14 DIAGNOSIS — R7301 Impaired fasting glucose: Secondary | ICD-10-CM | POA: Diagnosis not present

## 2020-11-14 DIAGNOSIS — H43399 Other vitreous opacities, unspecified eye: Secondary | ICD-10-CM | POA: Diagnosis not present

## 2020-11-14 DIAGNOSIS — F419 Anxiety disorder, unspecified: Secondary | ICD-10-CM | POA: Diagnosis not present

## 2020-11-14 DIAGNOSIS — I2510 Atherosclerotic heart disease of native coronary artery without angina pectoris: Secondary | ICD-10-CM | POA: Diagnosis not present

## 2020-11-14 DIAGNOSIS — R82998 Other abnormal findings in urine: Secondary | ICD-10-CM | POA: Diagnosis not present

## 2020-11-14 DIAGNOSIS — K219 Gastro-esophageal reflux disease without esophagitis: Secondary | ICD-10-CM | POA: Diagnosis not present

## 2020-11-14 DIAGNOSIS — I499 Cardiac arrhythmia, unspecified: Secondary | ICD-10-CM | POA: Diagnosis not present

## 2021-05-08 ENCOUNTER — Telehealth: Payer: Self-pay | Admitting: Internal Medicine

## 2021-05-08 NOTE — Telephone Encounter (Signed)
Spoke with pt who states he was using his wife's Fitbit last night that reported an episode of Afib.  Pt states FItbit states normal rhythm this morning.  Pt has had no symptoms.  He has a history of Aflutter and Atrial Tach.  Pt is past due for f/u with Dr Caryl Comes.  Appointment scheduled for 05/18/2021 at 8am with Oda Kilts, PA-C.  Reviewed ED precautions.  Pt verbalizes understanding and agrees with current plan.

## 2021-05-08 NOTE — Telephone Encounter (Signed)
Pt called to let our office know that last night he borrowed his wife's Fit bit and it stated that he was in Afib, then he put it on this morning and it said he was ok. Pt is feeling fine No problems, he just wanted to pass this information on to Dr. Caryl Comes

## 2021-05-15 NOTE — Progress Notes (Signed)
PCP:  [name], MD Primary Cardiologist: None Electrophysiologist: [name], MD   Austin Parks is a 75 y.o. male with history of atrial flutter/atypical s/p ablation 08/2016, atrial tachycardia, HLD, and PVCs seen today for [name], MD for acute visit due to "atrial fib" on wearable monitor.  Since last being seen in our clinic the patient reports doing well overall. He had his apple watch alert him to rapid HRs (as high as 190). His is the original apple watch so no EKG capabilities. He put on his wifes fit bit which showed "possible atrial fibrillation". He states it was irregular, but not fast. He checked it again in the morning and it was normal again.  The same thing happened 2 days later. One of the episodes he was working on a small toy car for his granddaughter. Neither event was associated with exertion or symptoms. He has not had any more alerts. He states the last time he had rapid HRs was "years" ago.   he denies chest pain, palpitations, dyspnea, PND, orthopnea, nausea, vomiting, dizziness, syncope, edema, weight gain, or early satiety.  Past Medical History:  Diagnosis Date  . Atrial tachycardia ([place])   . Back pain, chronic   . Benign prostatic hypertrophy    with obstruction  . Bradycardia    ectopic  . Erectile dysfunction of organic origin   . Gastroesophageal reflux disease   . Hypercholesterolemia    borderline. admitted january 2011 for chest pain-all stress related it was felt to be a-flutter 7/17  . Mild aortic insufficiency   . Mild mitral regurgitation   . Premature atrial contractions   . PVC's (premature ventricular contractions)   . Typical atrial flutter (HCC)    a. reverse typical atrial flutter s/p ablation 08/2016.   Past Surgical History:  Procedure Laterality Date  . ABLATION OF DYSRHYTHMIC FOCUS  08/22/2016  . ELECTROPHYSIOLOGIC STUDY N/A 08/22/2016   Procedure: A-Flutter Ablation;  Surgeon: [name], MD;  Location: [facility];  Service: Cardiovascular;  Laterality: N/A;  . TONSILECTOMY/ADENOIDECTOMY WITH MYRINGOTOMY  1967  . TRICEPS TENDON REPAIR  08/2013   Dr. [name]    Current Outpatient Medications  Medication Sig Dispense Refill  . cholecalciferol (VITAMIN D) 1000 units tablet Take 2,000 Units by mouth daily.    . Coenzyme Q10 200 MG capsule Take 200 mg by mouth daily.     Marland Kitchen ezetimibe (ZETIA) 10 MG tablet Take 10 mg by mouth daily.    . Multiple Vitamins-Minerals (MULTIVITAMIN ADULT PO) Take 1 tablet by mouth daily. Centrum Firefighter for men    . Omega-3 Fatty Acids (FISH OIL) 1000 MG CAPS Take 1 capsule by mouth every other day.    . rosuvastatin (CRESTOR) 40 MG tablet Take 20 mg by mouth at bedtime.     . Turmeric (QC TUMERIC COMPLEX PO) Take 1,000 mg by mouth daily.    . vitamin C (ASCORBIC ACID) 500 MG tablet Take 500 mg by mouth daily.    Marland Kitchen zinc gluconate 50 MG tablet Take 50 mg by mouth 3 (three) times a week.     Current Facility-Administered Medications  Medication Dose Route Frequency Provider Last Rate Last Admin  . 0.9 %  sodium chloride infusion  500 mL Intravenous Once [name], MD        No Known Allergies  Social History   Socioeconomic History  . Marital status: Married    Spouse name: Not on file  .  Number of children: 3  . Years of education: college  . Highest education level: Not on file  Occupational History  . Occupation: VP of Psychologist, educational at [employer].  Tobacco Use  . Smoking status: Never Smoker  . Smokeless tobacco: Former Network engineer and Sexual Activity  . Alcohol use: No  . Drug use: No  . Sexual activity: Not on file  Other Topics Concern  . 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 He lives with his 2nd wife of 30+ years (prior marriage lasted 3 years) and has 2 children. Patient denies history of alcohol and tobacco use. [name] and  [name] children. [name] is peds resident at [facility] as of 2011) retired 2/12. In 2016 still working some. Part time job at [employer] and [employer].   Social Determinants of Health   Financial Resource Strain: Not on file  Food Insecurity: Not on file  Transportation Needs: Not on file  Physical Activity: Not on file  Stress: Not on file  Social Connections: Not on file  Intimate Partner Violence: Not on file     Review of Systems: General: No chills, fever, night sweats or weight changes  Cardiovascular:  No chest pain, dyspnea on exertion, edema, orthopnea, palpitations, paroxysmal nocturnal dyspnea Dermatological: No rash, lesions or masses Respiratory: No cough, dyspnea Urologic: No hematuria, dysuria Abdominal: No nausea, vomiting, diarrhea, bright red blood per rectum, melena, or hematemesis Neurologic: No visual changes, weakness, changes in mental status All other systems reviewed and are otherwise negative except as noted above.  Physical Exam: Vitals:   05/16/21 0801  BP: 126/68  Pulse: 60  SpO2: 98%  Weight: 205 lb 6.4 oz (93.2 kg)  Height: 6\' 3"  (1.905 m)    GEN- The patient is well appearing, alert and oriented x 3 today.   HEENT: normocephalic, atraumatic; sclera clear, conjunctiva pink; hearing intact; oropharynx clear; neck supple, no JVP Lymph- no cervical lymphadenopathy Lungs- Clear to ausculation bilaterally, normal work of breathing.  No wheezes, rales, rhonchi Heart- Regular rate and rhythm, no murmurs, rubs or gallops, PMI not laterally displaced GI- soft, non-tender, non-distended, bowel sounds present, no hepatosplenomegaly Extremities- no clubbing, cyanosis, or edema; DP/PT/radial pulses 2+ bilaterally MS- no significant deformity or atrophy Skin- warm and dry, no rash or lesion Psych- euthymic mood, full affect Neuro- strength and sensation are intact  EKG is ordered. Personal review of EKG from today shows NSR at 60 bpm, 1st  degree AV block at 260 ms, QRS 82 ms  Additional studies reviewed include: Previous EP office notes  Assessment and Plan:  1. Atrial flutter; typical s/p ablation 2017  2. Atrial tachycardia- non-sustained  3. HLD on statin therapy  EKG today shows NSR. Pt with high HR alerts on apple watch, but no data available to review. Symptomatically he has been doing well, and the HR alerts have resolved quickly though Fit bit has alert to "possible AF" when measured with those.     We discussed monitoring vs wearables at length. Pt has now gone almost 10 days without further issues. It's unclear if a 14 day Zio or even a 30 day monitor would catch these events at this time.  Pt will get either a Kardia, or update his current Apple Watch to an EKG capable model (Series 4 or greater) in attempt to further clarify his dysrhythmia.  If HR excursions increase in frequency, could consider live monitoring, but I don't think this has occurred frequently enough to definitely be seen on a Zio/Event monitor.   Labs today.   Patient is currently asymptomatic, no real need to update echo at this point, but consider if symptoms persist or need to consider AAD.   IF atrial fibrillation is identified, his CHA2DS2/VASC score would be 3 for vascular disease and age.  Without significant burden and no h/o stroke, could consider continued monitoring for SCAF, up to and including the possibility of a loop recorder.   Shirley Friar, PA-C  05/16/21 8:06 AM

## 2021-05-16 ENCOUNTER — Encounter: Payer: Self-pay | Admitting: Student

## 2021-05-16 ENCOUNTER — Ambulatory Visit: Payer: PPO | Admitting: Student

## 2021-05-16 ENCOUNTER — Other Ambulatory Visit: Payer: Self-pay

## 2021-05-16 VITALS — BP 126/68 | HR 60 | Ht 75.0 in | Wt 205.4 lb

## 2021-05-16 DIAGNOSIS — Z9889 Other specified postprocedural states: Secondary | ICD-10-CM | POA: Diagnosis not present

## 2021-05-16 DIAGNOSIS — I4892 Unspecified atrial flutter: Secondary | ICD-10-CM | POA: Diagnosis not present

## 2021-05-16 DIAGNOSIS — Z8679 Personal history of other diseases of the circulatory system: Secondary | ICD-10-CM | POA: Diagnosis not present

## 2021-05-16 DIAGNOSIS — R002 Palpitations: Secondary | ICD-10-CM

## 2021-05-16 DIAGNOSIS — E7800 Pure hypercholesterolemia, unspecified: Secondary | ICD-10-CM | POA: Diagnosis not present

## 2021-05-16 LAB — BASIC METABOLIC PANEL
BUN/Creatinine Ratio: 18 (ref 10–24)
BUN: 17 mg/dL (ref 8–27)
CO2: 23 mmol/L (ref 20–29)
Calcium: 9.1 mg/dL (ref 8.6–10.2)
Chloride: 103 mmol/L (ref 96–106)
Creatinine, Ser: 0.96 mg/dL (ref 0.76–1.27)
Glucose: 104 mg/dL — ABNORMAL HIGH (ref 65–99)
Potassium: 4.8 mmol/L (ref 3.5–5.2)
Sodium: 139 mmol/L (ref 134–144)
eGFR: 82 mL/min/{1.73_m2} (ref >59)

## 2021-05-16 LAB — CBC
Hematocrit: 37.6 % (ref 37.5–51.0)
Hemoglobin: 12.5 g/dL — ABNORMAL LOW (ref 13.0–17.7)
MCH: 29.3 pg (ref 26.6–33.0)
MCHC: 33.2 g/dL (ref 31.5–35.7)
MCV: 88 fL (ref 79–97)
Platelets: 273 10*3/uL (ref 150–450)
RBC: 4.27 x10E6/uL (ref 4.14–5.80)
RDW: 12.9 % (ref 11.6–15.4)
WBC: 2.9 10*3/uL — ABNORMAL LOW (ref 3.4–10.8)

## 2021-05-16 LAB — TSH: TSH: 3.41 u[IU]/mL (ref 0.450–4.500)

## 2021-05-16 NOTE — Patient Instructions (Addendum)
Medication Instructions:   Your physician recommends that you continue on your current medications as directed. Please refer to the Current Medication list given to you today.  *If you need a refill on your cardiac medications before your next appointment, please call your pharmacy*   Lab Work: TODAY: BMET, CBC, TSH  If you have labs (blood work) drawn today and your tests are completely normal, you will receive your results only by: Marland Kitchen MyChart Message (if you have MyChart) OR . A paper copy in the mail If you have any lab test that is abnormal or we need to change your treatment, we will call you to review the results.   Follow-Up: At [facility], you and your health needs are our priority.  As part of our continuing mission to provide you with exceptional heart care, we have created designated Provider Care Teams.  These Care Teams include your primary Cardiologist (physician) and Advanced Practice Providers (APPs -  Physician Assistants and Nurse Practitioners) who all work together to provide you with the care you need, when you need it.   Your next appointment:   As scheduled  Other Instructions [employer]  FDA-cleared EKG at your fingertips. - [employer]   Agricultural engineer, [employer]. [email or web address]   FDA-cleared, clinical grade mobile EKG monitor: Jodelle Red is the most clinically-validated mobile EKG used by the world's leading cardiac care medical professionals.  This may be useful in monitoring palpitations.  We do not have access to have them emailed and reviewed but will be glad to review while in the office.

## 2021-05-29 ENCOUNTER — Ambulatory Visit (HOSPITAL_COMMUNITY)
Admission: RE | Admit: 2021-05-29 | Discharge: 2021-05-29 | Disposition: A | Discharge status: Home / Self Care | Payer: PPO | Source: Ambulatory Visit | Attending: Physician Assistant | Admitting: Physician Assistant

## 2021-05-29 ENCOUNTER — Other Ambulatory Visit: Payer: Self-pay

## 2021-05-29 ENCOUNTER — Encounter (HOSPITAL_COMMUNITY): Payer: Self-pay | Admitting: Physician Assistant

## 2021-05-29 VITALS — BP 144/88 | HR 69 | Ht 75.0 in | Wt 204.0 lb

## 2021-05-29 DIAGNOSIS — R002 Palpitations: Secondary | ICD-10-CM | POA: Diagnosis not present

## 2021-05-29 DIAGNOSIS — Z79899 Other long term (current) drug therapy: Secondary | ICD-10-CM | POA: Diagnosis not present

## 2021-05-29 DIAGNOSIS — I4892 Unspecified atrial flutter: Secondary | ICD-10-CM | POA: Insufficient documentation

## 2021-05-29 DIAGNOSIS — I483 Typical atrial flutter: Secondary | ICD-10-CM | POA: Diagnosis not present

## 2021-05-29 DIAGNOSIS — E785 Hyperlipidemia, unspecified: Secondary | ICD-10-CM | POA: Insufficient documentation

## 2021-05-29 DIAGNOSIS — I471 Supraventricular tachycardia: Secondary | ICD-10-CM | POA: Diagnosis not present

## 2021-05-29 NOTE — Progress Notes (Signed)
Thank you so much for seeing him!

## 2021-05-29 NOTE — Progress Notes (Signed)
Primary Care Physician: Austin Infante, MD Primary Electrophysiologist: Dr Austin Parks Referring Physician: Oda Parks   Austin Parks is a 75 y.o. male with a history of atrial flutter, atrial tachycardia, HLD who presents for follow up in the [facility].  The patient was diagnosed with atrial flutter and underwent typical flutter ablation in 2017. Patient has a CHADS2VASC score of 2. He was seen on 05/16/21 for "possible afib" seen on Apple Watch. He does have a history of an irregular heart beat with PVCs and PACs. He purchased a Kardia device which has also shown possible afib. Strips personally reviewed in office today which show SR with PACs. He does have symptoms of palpitations intermittently.   Today, he denies symptoms of chest pain, shortness of breath, orthopnea, PND, lower extremity edema, dizziness, presyncope, syncope, snoring, daytime somnolence, bleeding, or neurologic sequela. The patient is tolerating medications without difficulties and is otherwise without complaint today.    Atrial Fibrillation Risk Factors:  he does not have symptoms or diagnosis of sleep apnea. he does not have a history of rheumatic fever.   he has a BMI of Body mass index is 25.5 kg/m.Marland Kitchen Filed Weights   05/29/21 1036  Weight: 92.5 kg    Family History  Problem Relation Age of Onset  . Lung cancer Mother   . Brain cancer Mother   . Stomach cancer Father 60  . Diabetes Mellitus II Paternal Aunt   . Diabetes Mellitus I Daughter   . Colon polyps Neg Hx   . Colon cancer Neg Hx   . Esophageal cancer Neg Hx   . Rectal cancer Neg Hx      Atrial Fibrillation Management history:  Previous antiarrhythmic drugs: none Previous cardioversions: none Previous ablations: 2017 flutter CHADS2VASC score: 2 Anticoagulation history: Eliquis   Past Medical History:  Diagnosis Date  . Atrial tachycardia ([place])   . Back pain, chronic   . Benign prostatic hypertrophy     with obstruction  . Bradycardia    ectopic  . Erectile dysfunction of organic origin   . Gastroesophageal reflux disease   . Hypercholesterolemia    borderline. admitted january 2011 for chest pain-all stress related it was felt to be a-flutter 7/17  . Mild aortic insufficiency   . Mild mitral regurgitation   . Premature atrial contractions   . PVC's (premature ventricular contractions)   . Typical atrial flutter (HCC)    a. reverse typical atrial flutter s/p ablation 08/2016.   Past Surgical History:  Procedure Laterality Date  . ABLATION OF DYSRHYTHMIC FOCUS  08/22/2016  . ELECTROPHYSIOLOGIC STUDY N/A 08/22/2016   Procedure: A-Flutter Ablation;  Surgeon: Austin Sprang, MD;  Location: [facility];  Service: Cardiovascular;  Laterality: N/A;  . TONSILECTOMY/ADENOIDECTOMY WITH MYRINGOTOMY  1967  . TRICEPS TENDON REPAIR  08/2013   Dr. Lorin Parks    Current Outpatient Medications  Medication Sig Dispense Refill  . cholecalciferol (VITAMIN D) 1000 units tablet Take 2,000 Units by mouth daily.    . Coenzyme Q10 200 MG capsule Take 200 mg by mouth daily.     Marland Kitchen ezetimibe (ZETIA) 10 MG tablet Take 10 mg by mouth daily.    . Multiple Vitamins-Minerals (MULTIVITAMIN ADULT PO) Take 1 tablet by mouth daily. Centrum Firefighter for men    . Omega-3 Fatty Acids (FISH OIL) 1000 MG CAPS Take 1 capsule by mouth every other day.    . rosuvastatin (CRESTOR) 40 MG tablet Take 20 mg  by mouth at bedtime.     . Turmeric (QC TUMERIC COMPLEX PO) Take 1,000 mg by mouth daily.    . vitamin C (ASCORBIC ACID) 500 MG tablet Take 500 mg by mouth daily.     Current Facility-Administered Medications  Medication Dose Route Frequency Provider Last Rate Last Admin  . 0.9 %  sodium chloride infusion  500 mL Intravenous Once Austin Shipper, MD        No Known Allergies  Social History   Socioeconomic History  . Marital status: Married    Spouse name: Not on file  . Number of children: 3  . Years of  education: college  . Highest education level: Not on file  Occupational History  . Occupation: VP of Psychologist, educational at [employer].  Tobacco Use  . Smoking status: Never Smoker  . Smokeless tobacco: Former Network engineer and Sexual Activity  . Alcohol use: No  . Drug use: No  . Sexual activity: Not on file  Other Topics Concern  . 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 He lives with his 2nd wife of 30+ years (prior marriage lasted 3 years) and has 2 children. Patient denies history of alcohol and tobacco use. Austin Parks and Austin Parks children. Austin Parks is peds resident at [facility] as of 2011) retired 2/12. In 2016 still working some. Part time job at [employer] and [employer].   Social Determinants of Health   Financial Resource Strain: Not on file  Food Insecurity: Not on file  Transportation Needs: Not on file  Physical Activity: Not on file  Stress: Not on file  Social Connections: Not on file  Intimate Partner Violence: Not on file     ROS- All systems are reviewed and negative except as per the HPI above.  Physical Exam: Vitals:   05/29/21 1036  BP: (!) 144/88  Pulse: 69  Weight: 92.5 kg  Height: 6\' 3"  (1.905 m)    GEN- The patient is a well appearing elderly male, alert and oriented x 3 today.   Head- normocephalic, atraumatic Eyes-  Sclera clear, conjunctiva pink Ears- hearing intact Oropharynx- clear Neck- supple  Lungs- Clear to ausculation bilaterally, normal work of breathing Heart- Regular rate and rhythm, occasional ectopic beat, no murmurs, rubs or gallops  GI- soft, NT, ND, + BS Extremities- no clubbing, cyanosis, or edema MS- no significant deformity or atrophy Skin- no rash or lesion Psych- euthymic mood, full affect Neuro- strength and sensation are intact  Wt Readings from Last 3 Encounters:  05/29/21 92.5 kg  05/16/21 93.2 kg  07/17/20  (!) 94.3 kg    EKG today demonstrates  SR, 1st degree AV block, frequent PACs Vent. rate 69 BPM PR interval 272 ms QRS duration 82 ms QT/QTcB 382/409 ms  Echo 08/06/16 demonstrated  - Left ventricle: The cavity size was normal. There was mild focal  basal hypertrophy of the septum. Systolic function was normal.  The estimated ejection fraction was in the range of 55% to 60%.  Wall motion was normal; there were no regional wall motion  abnormalities. Features are consistent with a pseudonormal left  ventricular filling pattern, with concomitant abnormal relaxation  and increased filling pressure (grade 2 diastolic dysfunction).  Doppler parameters are consistent with elevated ventricular  end-diastolic filling pressure.  - Aortic valve: Trileaflet; mildly thickened, mildly calcified  leaflets. There was  mild regurgitation.  - Aortic root: The aortic root was normal in size.  - Ascending aorta: The ascending aorta was normal in size.  - Mitral valve: Structurally normal valve. There was mild  regurgitation.  - Left atrium: The atrium was moderately dilated.  - Right ventricle: The cavity size was normal. Wall thickness was  normal. Systolic function was normal.  - Right atrium: The atrium was normal in size.  - Tricuspid valve: There was mild regurgitation.  - Pulmonary arteries: Systolic pressure was within the normal  range.  - Inferior vena cava: The vessel was normal in size.  - Pericardium, extracardiac: There was no pericardial effusion.  Epic records are reviewed at length today  CHA2DS2-VASc Score = 2  The patient's score is based upon: CHF History: No HTN History: No Diabetes History: No Stroke History: No Vascular Disease History: No Age Score: 2 Gender Score: 0      ASSESSMENT AND PLAN: 1. Atrial flutter/atrial tachycardia The patient's CHA2DS2-VASc score is 2, indicating a 2.2% annual risk of stroke.   General education about afib  provided and questions answered. We also discussed his stroke risk and the risks and benefits of anticoagulation. We reviewed his Kardia mobile strips together in office today. They appear more consistent with SR with PACs rather than afib. In the absence of a clear diagnosis of afib, will not start Ogilvie at this time.  Check 14 day Zio patch to evaluate for arrhythmias.  Check echocardiogram   Follow up with Dr Austin Parks as scheduled. Sooner in AF clinic if afib identified.    [facility] [address] [place], [place] [address] [phone] 05/29/2021 12:04 PM

## 2021-06-12 ENCOUNTER — Ambulatory Visit (HOSPITAL_COMMUNITY)
Admission: RE | Admit: 2021-06-12 | Discharge: 2021-06-12 | Disposition: A | Discharge status: Home / Self Care | Payer: PPO | Source: Ambulatory Visit | Attending: Physician Assistant | Admitting: Physician Assistant

## 2021-06-12 ENCOUNTER — Other Ambulatory Visit: Payer: Self-pay

## 2021-06-12 DIAGNOSIS — I4892 Unspecified atrial flutter: Secondary | ICD-10-CM | POA: Insufficient documentation

## 2021-06-12 DIAGNOSIS — E785 Hyperlipidemia, unspecified: Secondary | ICD-10-CM | POA: Diagnosis not present

## 2021-06-12 DIAGNOSIS — I483 Typical atrial flutter: Secondary | ICD-10-CM

## 2021-06-12 DIAGNOSIS — R9431 Abnormal electrocardiogram [ECG] [EKG]: Secondary | ICD-10-CM | POA: Diagnosis not present

## 2021-06-12 DIAGNOSIS — I351 Nonrheumatic aortic (valve) insufficiency: Secondary | ICD-10-CM | POA: Insufficient documentation

## 2021-06-12 DIAGNOSIS — R002 Palpitations: Secondary | ICD-10-CM | POA: Diagnosis not present

## 2021-06-12 DIAGNOSIS — I313 Pericardial effusion (noninflammatory): Secondary | ICD-10-CM | POA: Insufficient documentation

## 2021-06-12 LAB — ECHOCARDIOGRAM COMPLETE
Area-P 1/2: 1.81 cm2
P 1/2 time: 568 msec
S' Lateral: 3.6 cm

## 2021-06-12 NOTE — Progress Notes (Signed)
  Echocardiogram 2D Echocardiogram has been performed.  Austin Parks 06/12/2021, 2:11 PM

## 2021-06-17 DIAGNOSIS — R002 Palpitations: Secondary | ICD-10-CM | POA: Diagnosis not present

## 2021-06-17 NOTE — Addendum Note (Signed)
Encounter addended by: Juluis Mire, RN on: 06/17/2021 3:42 PM  Actions taken: Imaging Exam ended

## 2021-06-18 ENCOUNTER — Encounter (HOSPITAL_COMMUNITY): Payer: Self-pay | Admitting: *Deleted

## 2021-06-27 ENCOUNTER — Other Ambulatory Visit (HOSPITAL_COMMUNITY): Payer: PPO

## 2021-08-08 ENCOUNTER — Other Ambulatory Visit: Payer: Self-pay

## 2021-08-08 ENCOUNTER — Ambulatory Visit: Payer: PPO | Admitting: Internal Medicine

## 2021-08-08 VITALS — BP 128/76 | HR 62 | Ht 75.0 in | Wt 204.6 lb

## 2021-08-08 DIAGNOSIS — Z8679 Personal history of other diseases of the circulatory system: Secondary | ICD-10-CM

## 2021-08-08 DIAGNOSIS — I471 Supraventricular tachycardia: Secondary | ICD-10-CM

## 2021-08-08 DIAGNOSIS — Z9889 Other specified postprocedural states: Secondary | ICD-10-CM | POA: Diagnosis not present

## 2021-08-08 NOTE — Patient Instructions (Signed)
Medication Instructions:  Your physician recommends that you continue on your current medications as directed. Please refer to the Current Medication list given to you today.  *If you need a refill on your cardiac medications before your next appointment, please call your pharmacy*   Lab Work: None ordered.  If you have labs (blood work) drawn today and your tests are completely normal, you will receive your results only by: [place] (if you have MyChart) OR A paper copy in the mail If you have any lab test that is abnormal or we need to change your treatment, we will call you to review the results.   Testing/Procedures: None ordered.    Follow-Up: At [facility] you and your health needs are our priority.  As part of our continuing mission to provide you with exceptional heart care, we have created designated Provider Care Teams.  These Care Teams include your primary Cardiologist (physician) and Advanced Practice Providers (APPs -  Physician Assistants and Nurse Practitioners) who all work together to provide you with the care you need, when you need it.  We recommend signing up for the patient portal called "MyChart".  Sign up information is provided on this After Visit Summary.  MyChart is used to connect with patients for Virtual Visits (Telemedicine).  Patients are able to view lab/test results, encounter notes, upcoming appointments, etc.  Non-urgent messages can be sent to your provider as well.   To learn more about what you can do with MyChart, go to [email or web address].    Your next appointment:   As scheduled with Dr Caryl Comes

## 2021-08-08 NOTE — Progress Notes (Signed)
Patient Care Team: [name], MD as PCP - General (Internal Medicine) [name], MD as PCP - Electrophysiology (Cardiology)   HPI  Austin Parks is a 75 y.o. male Seen with a chief complaint of atrial flutter-atypical for which he underwent EP testing and catheter ablation 9/17.  At that time, reverse typical flutter was identified.  He was seen May 2018 with apple watch recordings of tachycardia.  He underwent Holter monitoring-personally reviewed-demonstrating nonsustained atrial tachycardia and PVCs.  Still with palpitations, but mostly arrhythmia detected by his device.  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     Past Medical History:  Diagnosis Date   Atrial tachycardia (HCC)    Back pain, chronic    Benign prostatic hypertrophy    with obstruction   Bradycardia    ectopic   Erectile dysfunction of organic origin    Gastroesophageal reflux disease    Hypercholesterolemia    borderline. admitted january 2011 for chest pain-all stress related it was felt to be a-flutter 7/17   Mild aortic insufficiency    Mild mitral regurgitation    Premature atrial contractions    PVC's (premature ventricular contractions)    Typical atrial flutter (HCC)    a. reverse typical atrial flutter s/p ablation 08/2016.    Past Surgical History:  Procedure Laterality Date   ABLATION OF DYSRHYTHMIC FOCUS  08/22/2016   ELECTROPHYSIOLOGIC STUDY N/A 08/22/2016   Procedure: A-Flutter Ablation;  Surgeon: [name], MD;  Location: [facility];  Service: Cardiovascular;  Laterality: N/A;   TONSILECTOMY/ADENOIDECTOMY WITH MYRINGOTOMY  [place]  08/2013   Dr. [name]    Current Outpatient Medications  Medication Sig Dispense Refill   cholecalciferol (VITAMIN D) 1000 units tablet Take 2,000 Units by mouth daily.      Coenzyme Q10 200 MG capsule Take 200 mg by mouth daily.      ezetimibe (ZETIA) 10 MG tablet Take 10 mg by mouth daily.     Multiple Vitamins-Minerals (MULTIVITAMIN ADULT PO) Take 1 tablet by mouth daily. Centrum Silver Senior for men     Omega-3 Fatty Acids (FISH OIL) 1000 MG CAPS Take 1 capsule by mouth every other day.     rosuvastatin (CRESTOR) 40 MG tablet Take 20 mg by mouth at bedtime.      vitamin C (ASCORBIC ACID) 500 MG tablet Take 500 mg by mouth daily.     Current Facility-Administered Medications  Medication Dose Route Frequency Provider Last Rate Last Admin   0.9 %  sodium chloride infusion  500 mL Intravenous Once [name], MD        No Known Allergies    Review of Systems negative except from HPI and PMH  Physical Exam BP 128/76   Pulse 62   Ht '6\' 3"'$  (1.905 m)   Wt 204 lb 9.6 oz (92.8 kg)   SpO2 96%   BMI 25.57 kg/m  Well developed and nourished in no acute distress HENT normal Neck supple with JVP-  Flat  Clear Regular rate and rhythm, no murmurs or gallops Abd-soft with active BS No Clubbing cyanosis edema Skin-warm and dry A & Oriented  Grossly normal sensory and motor function  ECG sinus @ 62 26/08/40  Assessment and  Plan  Atrial flutter  typical-status post  ablation  Atrial tachycardia-nonsustained  Hyperlipidemia on statin therapy  Multiple strips were reviewed.  Appears atrial fibrillation but it is hard because he has P waves with varying morphologies during sinus rhythm with PACs.  Longest duration of a presumed A. fib episode was about 23 hours.  That would be sufficient for anticoagulation based on Assert 2       Current medicines are reviewed at length with the patient today .  The patient does not  have concerns regarding medicines.

## 2021-09-28 DIAGNOSIS — Z23 Encounter for immunization: Secondary | ICD-10-CM | POA: Diagnosis not present

## 2021-10-03 DIAGNOSIS — H43813 Vitreous degeneration, bilateral: Secondary | ICD-10-CM | POA: Diagnosis not present

## 2021-10-03 DIAGNOSIS — H5203 Hypermetropia, bilateral: Secondary | ICD-10-CM | POA: Diagnosis not present

## 2021-10-03 DIAGNOSIS — H2513 Age-related nuclear cataract, bilateral: Secondary | ICD-10-CM | POA: Diagnosis not present

## 2021-11-25 ENCOUNTER — Other Ambulatory Visit: Payer: Self-pay

## 2021-11-25 ENCOUNTER — Encounter: Payer: Self-pay | Admitting: Internal Medicine

## 2021-11-25 ENCOUNTER — Ambulatory Visit: Payer: PPO | Admitting: Internal Medicine

## 2021-11-25 VITALS — BP 110/80 | HR 62 | Ht 75.0 in | Wt 206.0 lb

## 2021-11-25 DIAGNOSIS — I471 Supraventricular tachycardia: Secondary | ICD-10-CM | POA: Diagnosis not present

## 2021-11-25 DIAGNOSIS — Z8679 Personal history of other diseases of the circulatory system: Secondary | ICD-10-CM

## 2021-11-25 DIAGNOSIS — I483 Typical atrial flutter: Secondary | ICD-10-CM | POA: Diagnosis not present

## 2021-11-25 DIAGNOSIS — Z9889 Other specified postprocedural states: Secondary | ICD-10-CM

## 2021-11-25 NOTE — Progress Notes (Signed)
Patient Care Team: [name], MD as PCP - General (Internal Medicine) [name], MD as PCP - Electrophysiology (Cardiology)   HPI  Austin Parks is a 75 y.o. male Seen with a chief complaint of atrial flutter-atypical for which he underwent EP testing and catheter ablation 9/17.  At that time, reverse typical flutter was identified.  He was seen May 2018 with apple watch recordings of tachycardia.  He underwent Holter monitoring-personally reviewed-demonstrating nonsustained atrial tachycardia and PVCs.  Strips were reviewed 8/22 from an event recorder; longest episode was only seconds Anticoagulation was not initiated following review of aliveCor monitoring        The patient denies chest pain, shortness of breath, nocturnal dyspnea, orthopnea or peripheral edema.  There have been no palpitations, lightheadedness or syncope  Two interval episodes of   afib x 2 for about 12 hours. Reviewed Alive Cor Strips  He states that he has not experienced it since the 23rd of this November.  He denies tightness of chest when walking, experiences some stiffness but nothing unusual.  He denies pedal edema. He admits to lower leg edema-asymmetric 2/2 venous insufficiency  DATE TEST EF   6/22 Echo   55-65 %              Date Cr K Hgb  5/18   15.3  5/22 0.96 4.8 12.5           Past Medical History:  Diagnosis Date   Atrial tachycardia (HCC)    Back pain, chronic    Benign prostatic hypertrophy    with obstruction   Bradycardia    ectopic   Erectile dysfunction of organic origin    Gastroesophageal reflux disease    Hypercholesterolemia    borderline. admitted january 2011 for chest pain-all stress related it was felt to be a-flutter 7/17   Mild aortic insufficiency    Mild mitral regurgitation    Premature atrial contractions    PVC's (premature ventricular contractions)    Typical atrial flutter (HCC)    a. reverse typical atrial flutter s/p ablation  08/2016.    Past Surgical History:  Procedure Laterality Date   ABLATION OF DYSRHYTHMIC FOCUS  08/22/2016   ELECTROPHYSIOLOGIC STUDY N/A 08/22/2016   Procedure: A-Flutter Ablation;  Surgeon: [name], MD;  Location: [place] [facility];  Service: Cardiovascular;  Laterality: N/A;   TONSILECTOMY/ADENOIDECTOMY WITH MYRINGOTOMY  [place]  08/2013   Dr. [name]    Current Outpatient Medications  Medication Sig Dispense Refill   cholecalciferol (VITAMIN D) 1000 units tablet Take 2,000 Units by mouth daily.     Coenzyme Q10 200 MG capsule Take 200 mg by mouth daily.      ezetimibe (ZETIA) 10 MG tablet Take 10 mg by mouth daily.     Multiple Vitamins-Minerals (MULTIVITAMIN ADULT PO) Take 1 tablet by mouth daily. Centrum Silver Senior for men     Omega-3 Fatty Acids (FISH OIL) 1000 MG CAPS Take 1 capsule by mouth every other day.     rosuvastatin (CRESTOR) 40 MG tablet Take 20 mg by mouth at bedtime.      TURMERIC PO Take 1,000 mg by mouth every other day.     vitamin C (ASCORBIC ACID) 500 MG tablet Take 500 mg by mouth daily.     Current Facility-Administered Medications  Medication Dose Route Frequency Provider Last Rate Last Admin   0.9 %  sodium chloride infusion  500  mL Intravenous Once [name], MD        No Known Allergies    Review of Systems negative except from HPI and PMH  Physical Exam BP 110/80 (BP Location: Left Arm, Patient Position: Sitting, Cuff Size: Normal)   Pulse 62   Ht 6\' 3"  (1.905 m)   Wt 206 lb (93.4 kg)   SpO2 97%   BMI 25.75 kg/m   Well developed and nourished in no acute distress HENT normal Neck supple with JVP-  flat   Clear Regular rate and rhythm, no murmurs or gallops Abd-soft with active BS No Clubbing cyanosis edema Skin-warm and dry A & Oriented  Grossly normal sensory and motor function  ECG normal sinus Rhythm 62 17/09/40   Assessment and  Plan  Atrial flutter  typical-status post ablation  Atrial  fibrillation  Hyperlipidemia on statin therapy per PCP   Anemia  Atrial fibrillation-recurrent.  Based on recent data, lower threshold for rhythm control versus observation or rate control.  With his longer and more frequent episodes, would favor referral for catheter ablation.  Have discussed benefits and risks in general, he will review with his wife and his daughter.  He is also anemic as noted above.  This is relatively new.  I reached out to his PCP for interval if needed.  This may require further evaluation.         Current medicines are reviewed at length with the patient today .  The patient does not  have concerns regarding medicines.    I,[name],acting as a Education administrator for [name], MD.,have documented all relevant documentation on the behalf of [name], MD,as directed by  [name], MD while in the presence of [name], MD. I, [name], MD, have reviewed all documentation for this visit. The documentation on 11/25/21 for the exam, diagnosis, procedures, and orders are all accurate and complete.

## 2021-11-25 NOTE — Patient Instructions (Signed)

## 2021-11-25 NOTE — Progress Notes (Deleted)
Patient Care Team: [name], MD as PCP - General (Internal Medicine) [name], MD as PCP - Electrophysiology (Cardiology)   HPI  Austin Parks is a 75 y.o. male Seen with a chief complaint of atrial flutter-atypical for which he underwent EP testing and catheter ablation 9/17.  At that time, reverse typical flutter was identified.  He was seen May 2018 with apple watch recordings of tachycardia.  He underwent Holter monitoring-personally reviewed-demonstrating nonsustained atrial tachycardia and PVCs.  Strips were reviewed 8/22 from an event recorder; longest episode was only seconds Anticoagulation was not initiated  Still with palpitations, but mostly arrhythmia detected by his device.  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   The patient denies chest pain***, shortness of breath***, nocturnal dyspnea***, orthopnea*** or peripheral edema***.  There have been no palpitations***, lightheadedness*** or syncope***.  Complains of ***.   DATE TEST EF   6/22 Echo   55-65 %   ***/*** ***  *** %           Past Medical History:  Diagnosis Date   Atrial tachycardia (HCC)    Back pain, chronic    Benign prostatic hypertrophy    with obstruction   Bradycardia    ectopic   Erectile dysfunction of organic origin    Gastroesophageal reflux disease    Hypercholesterolemia    borderline. admitted january 2011 for chest pain-all stress related it was felt to be a-flutter 7/17   Mild aortic insufficiency    Mild mitral regurgitation    Premature atrial contractions    PVC's (premature ventricular contractions)    Typical atrial flutter (HCC)    a. reverse typical atrial flutter s/p ablation 08/2016.    Past Surgical History:  Procedure Laterality Date   ABLATION OF DYSRHYTHMIC FOCUS  08/22/2016   ELECTROPHYSIOLOGIC STUDY N/A 08/22/2016    Procedure: A-Flutter Ablation;  Surgeon: [name], MD;  Location: [place] [facility];  Service: Cardiovascular;  Laterality: N/A;   TONSILECTOMY/ADENOIDECTOMY WITH MYRINGOTOMY  [place]  08/2013   Dr. [name]    Current Outpatient Medications  Medication Sig Dispense Refill   cholecalciferol (VITAMIN D) 1000 units tablet Take 2,000 Units by mouth daily.     Coenzyme Q10 200 MG capsule Take 200 mg by mouth daily.      ezetimibe (ZETIA) 10 MG tablet Take 10 mg by mouth daily.     Multiple Vitamins-Minerals (MULTIVITAMIN ADULT PO) Take 1 tablet by mouth daily. Centrum Silver Senior for men     Omega-3 Fatty Acids (FISH OIL) 1000 MG CAPS Take 1 capsule by mouth every other day.     rosuvastatin (CRESTOR) 40 MG tablet Take 20 mg by mouth at bedtime.      TURMERIC PO Take 1,000 mg by mouth every other day.     vitamin C (ASCORBIC ACID) 500 MG tablet Take 500 mg by mouth daily.     Current Facility-Administered Medications  Medication Dose Route Frequency Provider Last Rate Last Admin   0.9 %  sodium chloride infusion  500 mL Intravenous Once [name], MD        No Known Allergies    Review of Systems negative except from HPI and PMH  Physical Exam BP 110/80 (BP Location: Left Arm, Patient Position: Sitting,  Cuff Size: Normal)   Pulse 62   Ht 6\' 3"  (1.905 m)   Wt 206 lb (93.4 kg)   SpO2 97%   BMI 25.75 kg/m   Well developed and nourished in no acute distress HENT normal Neck supple with JVP-  flat *** Clear Regular rate and rhythm, no murmurs or gallops Abd-soft with active BS No Clubbing cyanosis edema Skin-warm and dry A & Oriented  Grossly normal sensory and motor function  ECG ***   Assessment and  Plan  Atrial flutter  typical-status post ablation  Atrial tachycardia-nonsustained  Hyperlipidemia on statin therapy  Multiple strips were reviewed.  Appears atrial fibrillation but it is hard because he has P waves with varying  morphologies during sinus rhythm with PACs.  Longest duration of a presumed A. fib episode was about 23 hours.  That would be sufficient for anticoagulation based on Assert 2       Current medicines are reviewed at length with the patient today .  The patient does not  have concerns regarding medicines.

## 2022-01-23 DIAGNOSIS — R7301 Impaired fasting glucose: Secondary | ICD-10-CM | POA: Diagnosis not present

## 2022-01-23 DIAGNOSIS — Z125 Encounter for screening for malignant neoplasm of prostate: Secondary | ICD-10-CM | POA: Diagnosis not present

## 2022-01-23 DIAGNOSIS — E785 Hyperlipidemia, unspecified: Secondary | ICD-10-CM | POA: Diagnosis not present

## 2022-01-23 DIAGNOSIS — I2510 Atherosclerotic heart disease of native coronary artery without angina pectoris: Secondary | ICD-10-CM | POA: Diagnosis not present

## 2022-01-30 DIAGNOSIS — R001 Bradycardia, unspecified: Secondary | ICD-10-CM | POA: Diagnosis not present

## 2022-01-30 DIAGNOSIS — Z1212 Encounter for screening for malignant neoplasm of rectum: Secondary | ICD-10-CM | POA: Diagnosis not present

## 2022-01-30 DIAGNOSIS — I4892 Unspecified atrial flutter: Secondary | ICD-10-CM | POA: Diagnosis not present

## 2022-01-30 DIAGNOSIS — Z1331 Encounter for screening for depression: Secondary | ICD-10-CM | POA: Diagnosis not present

## 2022-01-30 DIAGNOSIS — I2510 Atherosclerotic heart disease of native coronary artery without angina pectoris: Secondary | ICD-10-CM | POA: Diagnosis not present

## 2022-01-30 DIAGNOSIS — R7301 Impaired fasting glucose: Secondary | ICD-10-CM | POA: Diagnosis not present

## 2022-01-30 DIAGNOSIS — N401 Enlarged prostate with lower urinary tract symptoms: Secondary | ICD-10-CM | POA: Diagnosis not present

## 2022-01-30 DIAGNOSIS — L309 Dermatitis, unspecified: Secondary | ICD-10-CM | POA: Diagnosis not present

## 2022-01-30 DIAGNOSIS — Z0000 Encounter for general adult medical examination without abnormal findings: Secondary | ICD-10-CM | POA: Diagnosis not present

## 2022-01-30 DIAGNOSIS — I480 Paroxysmal atrial fibrillation: Secondary | ICD-10-CM | POA: Diagnosis not present

## 2022-01-30 DIAGNOSIS — Z1339 Encounter for screening examination for other mental health and behavioral disorders: Secondary | ICD-10-CM | POA: Diagnosis not present

## 2022-01-30 DIAGNOSIS — R82998 Other abnormal findings in urine: Secondary | ICD-10-CM | POA: Diagnosis not present

## 2022-01-30 DIAGNOSIS — N529 Male erectile dysfunction, unspecified: Secondary | ICD-10-CM | POA: Diagnosis not present

## 2022-02-03 ENCOUNTER — Encounter: Payer: Self-pay | Admitting: Internal Medicine

## 2022-02-09 ENCOUNTER — Encounter: Payer: Self-pay | Admitting: Emergency Medicine

## 2022-02-09 ENCOUNTER — Emergency Department (INDEPENDENT_AMBULATORY_CARE_PROVIDER_SITE_OTHER): Payer: PPO

## 2022-02-09 ENCOUNTER — Other Ambulatory Visit: Payer: Self-pay

## 2022-02-09 ENCOUNTER — Emergency Department
Admission: EM | Admit: 2022-02-09 | Discharge: 2022-02-09 | Disposition: A | Discharge status: Home / Self Care | Payer: PPO | Source: Home / Self Care | Attending: Family Medicine | Admitting: Family Medicine

## 2022-02-09 DIAGNOSIS — R0989 Other specified symptoms and signs involving the circulatory and respiratory systems: Secondary | ICD-10-CM

## 2022-02-09 DIAGNOSIS — R051 Acute cough: Secondary | ICD-10-CM | POA: Diagnosis not present

## 2022-02-09 DIAGNOSIS — J209 Acute bronchitis, unspecified: Secondary | ICD-10-CM | POA: Diagnosis not present

## 2022-02-09 DIAGNOSIS — J984 Other disorders of lung: Secondary | ICD-10-CM | POA: Diagnosis not present

## 2022-02-09 MED ORDER — PREDNISONE 20 MG PO TABS: 20.0000 mg | ORAL_TABLET | Freq: Two times a day (BID) | ORAL | 0 refills | Status: DC

## 2022-02-09 MED ORDER — AZITHROMYCIN 250 MG PO TABS: ORAL_TABLET | ORAL | 0 refills | Status: DC

## 2022-02-09 NOTE — ED Triage Notes (Addendum)
Patient presents to Urgent Care with complaints of cough worst at night and nasal congestion since 1 week ago. Patient reports keeping his grandchildren a lot. Cough is productive (clear and colored). Denies any sob or wheezing, fever. Does have chills. He does want an chest xray. Home covid test was negative x 2 days ago. Had covid in January.

## 2022-02-09 NOTE — ED Provider Notes (Signed)
[facility]    CSN: [id number] Arrival date & time: 02/09/22  3500      History   Chief Complaint Chief Complaint  Patient presents with   Cough    HPI Austin Parks is a 76 y.o. male.   HPI Patient has runny stuffy nose and coughing that is been going on for more than a week.  He is coughing up purulent sputum.  His chest hurts from the coughing.  He is having bad coughing spells.  Having periodic headaches.  Sinus congestion, postnasal drip.  He did do a home COVID test that was negative.  He states the grandchildren have been at his home and have been sick.  He did a COVID test which was negative.  No one else in the household is sick right now.  He states that his chest hurts from the coughing. Patient states he does not have any underlying lung disease, COPD, emphysema or asthma Patient has well-controlled heart disease Past Medical History:  Diagnosis Date   Atrial tachycardia (HCC)    Back pain, chronic    Benign prostatic hypertrophy    with obstruction   Bradycardia    ectopic   Erectile dysfunction of organic origin    Gastroesophageal reflux disease    Hypercholesterolemia    borderline. admitted january 2011 for chest pain-all stress related it was felt to be a-flutter 7/17   Mild aortic insufficiency    Mild mitral regurgitation    Premature atrial contractions    PVC's (premature ventricular contractions)    Typical atrial flutter (HCC)    a. reverse typical atrial flutter s/p ablation 08/2016.    Patient Active Problem List   Diagnosis Date Noted   Status post ablation of atrial flutter 02/21/2020   Atrial tachycardia ([place]) 02/18/2020   Typical atrial flutter (HCC)    Premature atrial contractions    Mild mitral regurgitation    Hypercholesterolemia    Mild aortic insufficiency    Atrial flutter ([place]) 08/22/2016    Past Surgical History:  Procedure Laterality Date   ABLATION OF DYSRHYTHMIC FOCUS  08/22/2016   ELECTROPHYSIOLOGIC  STUDY N/A 08/22/2016   Procedure: A-Flutter Ablation;  Surgeon: [name], MD;  Location: [place] [facility];  Service: Cardiovascular;  Laterality: N/A;   TONSILECTOMY/ADENOIDECTOMY WITH MYRINGOTOMY  [place]  08/2013   Dr. [name]       Home Medications    Prior to Admission medications   Medication Sig Start Date End Date Taking? Authorizing Provider  azithromycin (ZITHROMAX Z-PAK) 250 MG tablet Take two pills today followed by one a day until gone 02/09/22  Yes Raylene Everts, MD  cholecalciferol (VITAMIN D) 1000 units tablet Take 2,000 Units by mouth daily.   Yes [provider]  Coenzyme Q10 200 MG capsule Take 200 mg by mouth daily.    Yes [provider]  ezetimibe (ZETIA) 10 MG tablet Take 10 mg by mouth daily.   Yes [provider]  Multiple Vitamins-Minerals (MULTIVITAMIN ADULT PO) Take 1 tablet by mouth daily. Centrum Firefighter for men   Yes [provider]  Omega-3 Fatty Acids (FISH OIL) 1000 MG CAPS Take 1 capsule by mouth every other day.   Yes [provider]  predniSONE (DELTASONE) 20 MG tablet Take 1 tablet (20 mg total) by mouth 2 (two) times daily with a meal. 02/09/22  Yes Raylene Everts, MD  rosuvastatin (CRESTOR) 40 MG tablet Take 20  mg by mouth at bedtime.    Yes [provider]  TURMERIC PO Take 1,000 mg by mouth every other day.   Yes [provider]  vitamin C (ASCORBIC ACID) 500 MG tablet Take 500 mg by mouth daily.   Yes [provider]    Family History Family History  Problem Relation Age of Onset   Lung cancer Mother    Brain cancer Mother    Stomach cancer Father 15   Diabetes Mellitus II Paternal Aunt    Diabetes Mellitus I Daughter    Colon polyps Neg Hx    Colon cancer Neg Hx    Esophageal cancer Neg Hx    Rectal cancer Neg Hx     Social History Social History   Tobacco Use   Smoking status: Never   Smokeless tobacco: Former   Substance Use Topics   Alcohol use: No   Drug use: No     Allergies   Patient has no known allergies.   Review of Systems Review of Systems See HPI  Physical Exam Triage Vital Signs ED Triage Vitals  Enc Vitals Group     BP 02/09/22 0947 105/70     Pulse Rate 02/09/22 0947 93     Resp 02/09/22 0947 16     Temp 02/09/22 0947 98.7 F (37.1 C)     Temp Source 02/09/22 0947 Oral     SpO2 02/09/22 0947 96 %     Weight --      Height --      Head Circumference --      Peak Flow --      Pain Score 02/09/22 0945 0     Pain Loc --      Pain Edu? --      Excl. in [place]? --    No data found.  Updated Vital Signs BP 105/70 (BP Location: Left Arm)    Pulse 93    Temp 98.7 F (37.1 C) (Oral)    Resp 16    SpO2 96%      Physical Exam Constitutional:      General: He is not in acute distress.    Appearance: Normal appearance. He is well-developed. He is ill-appearing.  HENT:     Head: Normocephalic and atraumatic.     Right Ear: Tympanic membrane and ear canal normal.     Left Ear: Tympanic membrane and ear canal normal.     Nose: Congestion and rhinorrhea present.     Mouth/Throat:     Pharynx: Posterior oropharyngeal erythema present.  Eyes:     Conjunctiva/sclera: Conjunctivae normal.     Pupils: Pupils are equal, round, and reactive to light.  Cardiovascular:     Rate and Rhythm: Normal rate and regular rhythm.     Heart sounds: Normal heart sounds.  Pulmonary:     Effort: Pulmonary effort is normal. No respiratory distress.     Breath sounds: Rhonchi present.  Abdominal:     General: There is no distension.     Palpations: Abdomen is soft.  Musculoskeletal:        General: Normal range of motion.     Cervical back: Normal range of motion.  Lymphadenopathy:     Cervical: No cervical adenopathy.  Skin:    General: Skin is warm and dry.  Neurological:     Mental Status: He is alert.  Psychiatric:        Mood and Affect: Mood normal.  Behavior:  Behavior normal.     UC Treatments / Results  Labs (all labs ordered are listed, but only abnormal results are displayed) Labs Reviewed - No data to display  EKG   Radiology DG Chest 2 View  Result Date: 02/09/2022 CLINICAL DATA:  Uneven breast sounds. EXAM: CHEST - 2 VIEW COMPARISON:  01/16/2010 FINDINGS: Stable linear scarring left base. The lungs are clear without focal pneumonia, edema, pneumothorax or pleural effusion. The cardiopericardial silhouette is within normal limits for size. The visualized bony structures of the thorax show no acute abnormality. IMPRESSION: No active cardiopulmonary disease. Electronically Signed   By: Misty Stanley M.D.   On: 02/09/2022 10:17    Procedures Procedures (including critical care time)  Medications Ordered in UC Medications - No data to display  Initial Impression / Assessment and Plan / UC Course  I have reviewed the triage vital signs and the nursing notes.  Pertinent labs & imaging results that were available during my care of the patient were reviewed by me and considered in my medical decision making (see chart for details).     Chest x-ray is normal.  Discussed with patient. Final Clinical Impressions(s) / UC Diagnoses   Final diagnoses:  Acute bronchitis, unspecified organism     Discharge Instructions      Chest x-ray is normal.  No evidence of pneumonia For bronchitis I am prescribing antibiotics.  Take azithromycin as directed.  2 pills today then 1 a day until gone Continue to drink lots of fluids.  Run a humidifier in your bedroom if you have 1. Take prednisone to reduce the inflammation in your lungs, will help reduce coughing Call your doctor if not improving by next week     ED Prescriptions     Medication Sig Dispense Auth. Provider   azithromycin (ZITHROMAX Z-PAK) 250 MG tablet Take two pills today followed by one a day until gone 6 tablet Raylene Everts, MD   predniSONE (DELTASONE) 20 MG tablet  Take 1 tablet (20 mg total) by mouth 2 (two) times daily with a meal. 10 tablet Raylene Everts, MD      PDMP not reviewed this encounter.   Raylene Everts, MD 02/09/22 [id number]

## 2022-02-09 NOTE — Discharge Instructions (Signed)
Chest x-ray is normal.  No evidence of pneumonia For bronchitis I am prescribing antibiotics.  Take azithromycin as directed.  2 pills today then 1 a day until gone Continue to drink lots of fluids.  Run a humidifier in your bedroom if you have 1. Take prednisone to reduce the inflammation in your lungs, will help reduce coughing Call your doctor if not improving by next week

## 2022-02-11 ENCOUNTER — Telehealth: Payer: Self-pay | Admitting: Internal Medicine

## 2022-02-11 NOTE — Telephone Encounter (Signed)
This has been addressed in another encounter.  See message below from Dr Caryl Comes sent to pt.  Good day, you can copy this and take it in and review with Dr. Joylene Draft.   There was a trial done called Assert 2 2017 Isabelle C. Johann Capers, Talbert Forest Allen Kell J.G.M. Crijns, Ginette Pitman, Linward Headland, Marcelino Freestone, [address] [place], [place] Wynona Dove. [place] ... Show moreAuthor Notes European Heart Journal, Volume 38, Issue 17, 21 Apr 2016, Pages [phone],   In which it was found that the risk of thromboembolism was associated with atrial fibrillation episodes of greater than but not less than 24 hours duration.  I do not feel strongly 1 where the other in the issue is not your age the issue is trying to understand whether the risk of stroke is sufficient to justify the relatively low risk of Eliquis.  Based on the data cited above, the recommendations typically are for atrial fibrillation to be of greater than 24 hours duration to justify initiation of anticoagulation   Hope this helps SK

## 2022-02-11 NOTE — Telephone Encounter (Signed)
° °  Pt is calling to f/u his mychart message sent on 02/03/22. He requesting if Rosann Auerbach can call him today

## 2022-03-31 DIAGNOSIS — Z79899 Other long term (current) drug therapy: Secondary | ICD-10-CM | POA: Diagnosis not present

## 2022-03-31 DIAGNOSIS — N200 Calculus of kidney: Secondary | ICD-10-CM | POA: Diagnosis not present

## 2022-03-31 DIAGNOSIS — K3532 Acute appendicitis with perforation and localized peritonitis, without abscess: Secondary | ICD-10-CM | POA: Diagnosis not present

## 2022-03-31 DIAGNOSIS — K37 Unspecified appendicitis: Secondary | ICD-10-CM | POA: Diagnosis not present

## 2022-03-31 DIAGNOSIS — Z7901 Long term (current) use of anticoagulants: Secondary | ICD-10-CM | POA: Diagnosis not present

## 2022-03-31 DIAGNOSIS — K3580 Unspecified acute appendicitis: Secondary | ICD-10-CM | POA: Diagnosis not present

## 2022-03-31 DIAGNOSIS — E7800 Pure hypercholesterolemia, unspecified: Secondary | ICD-10-CM | POA: Diagnosis not present

## 2022-03-31 DIAGNOSIS — Z20822 Contact with and (suspected) exposure to covid-19: Secondary | ICD-10-CM | POA: Diagnosis not present

## 2022-03-31 DIAGNOSIS — R1032 Left lower quadrant pain: Secondary | ICD-10-CM | POA: Diagnosis not present

## 2022-03-31 DIAGNOSIS — R1031 Right lower quadrant pain: Secondary | ICD-10-CM | POA: Diagnosis not present

## 2022-03-31 DIAGNOSIS — I4891 Unspecified atrial fibrillation: Secondary | ICD-10-CM | POA: Diagnosis not present

## 2022-03-31 DIAGNOSIS — D373 Neoplasm of uncertain behavior of appendix: Secondary | ICD-10-CM | POA: Diagnosis not present

## 2022-04-01 DIAGNOSIS — K3580 Unspecified acute appendicitis: Secondary | ICD-10-CM | POA: Diagnosis not present

## 2022-04-01 DIAGNOSIS — K37 Unspecified appendicitis: Secondary | ICD-10-CM | POA: Diagnosis not present

## 2022-04-01 DIAGNOSIS — D121 Benign neoplasm of appendix: Secondary | ICD-10-CM | POA: Insufficient documentation

## 2022-04-03 DIAGNOSIS — K37 Unspecified appendicitis: Secondary | ICD-10-CM | POA: Diagnosis not present

## 2022-04-23 DIAGNOSIS — D490 Neoplasm of unspecified behavior of digestive system: Secondary | ICD-10-CM | POA: Diagnosis not present

## 2022-05-02 DIAGNOSIS — E7800 Pure hypercholesterolemia, unspecified: Secondary | ICD-10-CM | POA: Diagnosis not present

## 2022-05-02 DIAGNOSIS — D490 Neoplasm of unspecified behavior of digestive system: Secondary | ICD-10-CM | POA: Diagnosis not present

## 2022-05-02 DIAGNOSIS — D125 Benign neoplasm of sigmoid colon: Secondary | ICD-10-CM | POA: Diagnosis not present

## 2022-05-02 DIAGNOSIS — K5730 Diverticulosis of large intestine without perforation or abscess without bleeding: Secondary | ICD-10-CM | POA: Diagnosis not present

## 2022-05-02 DIAGNOSIS — K641 Second degree hemorrhoids: Secondary | ICD-10-CM | POA: Diagnosis not present

## 2022-05-02 DIAGNOSIS — D120 Benign neoplasm of cecum: Secondary | ICD-10-CM | POA: Diagnosis not present

## 2022-05-02 DIAGNOSIS — Z01818 Encounter for other preprocedural examination: Secondary | ICD-10-CM | POA: Diagnosis not present

## 2022-05-02 DIAGNOSIS — K635 Polyp of colon: Secondary | ICD-10-CM | POA: Diagnosis not present

## 2022-05-06 DIAGNOSIS — C169 Malignant neoplasm of stomach, unspecified: Secondary | ICD-10-CM | POA: Insufficient documentation

## 2022-05-06 DIAGNOSIS — E7800 Pure hypercholesterolemia, unspecified: Secondary | ICD-10-CM | POA: Diagnosis not present

## 2022-05-06 DIAGNOSIS — C181 Malignant neoplasm of appendix: Secondary | ICD-10-CM | POA: Diagnosis not present

## 2022-05-06 DIAGNOSIS — D120 Benign neoplasm of cecum: Secondary | ICD-10-CM | POA: Diagnosis not present

## 2022-05-06 DIAGNOSIS — N400 Enlarged prostate without lower urinary tract symptoms: Secondary | ICD-10-CM | POA: Diagnosis not present

## 2022-05-06 DIAGNOSIS — D126 Benign neoplasm of colon, unspecified: Secondary | ICD-10-CM | POA: Diagnosis not present

## 2022-05-06 DIAGNOSIS — I4891 Unspecified atrial fibrillation: Secondary | ICD-10-CM | POA: Diagnosis not present

## 2022-05-06 DIAGNOSIS — Z7901 Long term (current) use of anticoagulants: Secondary | ICD-10-CM | POA: Diagnosis not present

## 2022-05-16 DIAGNOSIS — C181 Malignant neoplasm of appendix: Secondary | ICD-10-CM | POA: Diagnosis not present

## 2022-05-16 DIAGNOSIS — R978 Other abnormal tumor markers: Secondary | ICD-10-CM | POA: Diagnosis not present

## 2022-05-16 DIAGNOSIS — Z09 Encounter for follow-up examination after completed treatment for conditions other than malignant neoplasm: Secondary | ICD-10-CM | POA: Diagnosis not present

## 2022-05-16 DIAGNOSIS — D490 Neoplasm of unspecified behavior of digestive system: Secondary | ICD-10-CM | POA: Diagnosis not present

## 2022-05-26 ENCOUNTER — Emergency Department (HOSPITAL_COMMUNITY): Payer: PPO

## 2022-05-26 ENCOUNTER — Emergency Department (HOSPITAL_COMMUNITY)
Admission: EM | Admit: 2022-05-26 | Discharge: 2022-05-26 | Disposition: A | Discharge status: Home / Self Care | Payer: PPO | Attending: Emergency Medicine | Admitting: Emergency Medicine

## 2022-05-26 ENCOUNTER — Ambulatory Visit (HOSPITAL_COMMUNITY)
Admission: EM | Admit: 2022-05-26 | Discharge: 2022-05-26 | Disposition: A | Discharge status: Other Acute Inpatient Hospital | Payer: PPO

## 2022-05-26 ENCOUNTER — Encounter (HOSPITAL_COMMUNITY): Payer: Self-pay | Admitting: Emergency Medicine

## 2022-05-26 DIAGNOSIS — Y9301 Activity, walking, marching and hiking: Secondary | ICD-10-CM | POA: Insufficient documentation

## 2022-05-26 DIAGNOSIS — S62622A Displaced fracture of medial phalanx of right middle finger, initial encounter for closed fracture: Secondary | ICD-10-CM | POA: Diagnosis not present

## 2022-05-26 DIAGNOSIS — S0990XA Unspecified injury of head, initial encounter: Secondary | ICD-10-CM

## 2022-05-26 DIAGNOSIS — G8929 Other chronic pain: Secondary | ICD-10-CM | POA: Insufficient documentation

## 2022-05-26 DIAGNOSIS — W01190A Fall on same level from slipping, tripping and stumbling with subsequent striking against furniture, initial encounter: Secondary | ICD-10-CM | POA: Insufficient documentation

## 2022-05-26 DIAGNOSIS — R9431 Abnormal electrocardiogram [ECG] [EKG]: Secondary | ICD-10-CM | POA: Diagnosis not present

## 2022-05-26 DIAGNOSIS — Y92009 Unspecified place in unspecified non-institutional (private) residence as the place of occurrence of the external cause: Secondary | ICD-10-CM | POA: Diagnosis not present

## 2022-05-26 DIAGNOSIS — M549 Dorsalgia, unspecified: Secondary | ICD-10-CM | POA: Diagnosis not present

## 2022-05-26 DIAGNOSIS — S0093XA Contusion of unspecified part of head, initial encounter: Secondary | ICD-10-CM | POA: Diagnosis not present

## 2022-05-26 DIAGNOSIS — Z7901 Long term (current) use of anticoagulants: Secondary | ICD-10-CM | POA: Diagnosis not present

## 2022-05-26 DIAGNOSIS — M79644 Pain in right finger(s): Secondary | ICD-10-CM | POA: Diagnosis present

## 2022-05-26 DIAGNOSIS — R519 Headache, unspecified: Secondary | ICD-10-CM | POA: Insufficient documentation

## 2022-05-26 DIAGNOSIS — S63282A Dislocation of proximal interphalangeal joint of right middle finger, initial encounter: Secondary | ICD-10-CM | POA: Diagnosis not present

## 2022-05-26 DIAGNOSIS — S62612A Displaced fracture of proximal phalanx of right middle finger, initial encounter for closed fracture: Secondary | ICD-10-CM | POA: Diagnosis not present

## 2022-05-26 DIAGNOSIS — Z043 Encounter for examination and observation following other accident: Secondary | ICD-10-CM | POA: Diagnosis not present

## 2022-05-26 DIAGNOSIS — W19XXXA Unspecified fall, initial encounter: Secondary | ICD-10-CM

## 2022-05-26 DIAGNOSIS — S63259A Unspecified dislocation of unspecified finger, initial encounter: Secondary | ICD-10-CM

## 2022-05-26 DIAGNOSIS — M189 Osteoarthritis of first carpometacarpal joint, unspecified: Secondary | ICD-10-CM | POA: Diagnosis not present

## 2022-05-26 DIAGNOSIS — S199XXA Unspecified injury of neck, initial encounter: Secondary | ICD-10-CM | POA: Diagnosis not present

## 2022-05-26 DIAGNOSIS — Z85038 Personal history of other malignant neoplasm of large intestine: Secondary | ICD-10-CM | POA: Diagnosis not present

## 2022-05-26 DIAGNOSIS — M47812 Spondylosis without myelopathy or radiculopathy, cervical region: Secondary | ICD-10-CM | POA: Diagnosis not present

## 2022-05-26 DIAGNOSIS — M4312 Spondylolisthesis, cervical region: Secondary | ICD-10-CM | POA: Diagnosis not present

## 2022-05-26 MED ORDER — LIDOCAINE HCL (PF) 1 % IJ SOLN
INTRAMUSCULAR | Status: AC
  Administered 2022-05-26: 10 mL
  Filled 2022-05-26: qty 10

## 2022-05-26 MED ORDER — LIDOCAINE HCL (PF) 1 % IJ SOLN: 10.0000 mL | Freq: Once | INTRAMUSCULAR | Status: AC

## 2022-05-26 NOTE — Discharge Instructions (Addendum)
You came to the emergency department today to be evaluated after suffering a fall.  The CT scan of your head and cervical spine did not show any acute abnormalities.  You did have a Dorsal dislocation of the third PIP joint with a displaced fracture at the volar corner at the base of the third middle phalanx.  This was reduced in the emergency department.  Please wear the brace we have given you and follow-up with the orthopedic provider listed on this paperwork.  Get help right away if: You have: A severe headache that is not helped by medicine. Trouble walking or weakness in your arms and legs. Clear or bloody fluid coming from your nose or ears. Changes in your vision. A seizure. Increased confusion or irritability. Your symptoms get worse. You are sleepier than normal and have trouble staying awake. You lose your balance. Your pupils change size. Your speech is slurred. Your dizziness gets worse. You vomit. You develop numbness or weakness to your affected finger You have color change to your affected finger
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
## 2022-05-26 NOTE — ED Triage Notes (Signed)
Patient here after tripping over a rug and hitting his the left of his forehead against a wood post. Denies LOC, is on eliquis. Patient complains of pain to forehead, right hand, and right knee.

## 2022-05-26 NOTE — Progress Notes (Signed)
Orthopedic Tech Progress Note Patient Details:  Austin Parks [facility] 10-04-1946 [id number]  Ortho Devices Type of Ortho Device: Finger splint Ortho Device/Splint Location: RUE Ortho Device/Splint Interventions: Application, Ordered   Post Interventions Patient Tolerated: Well Instructions Provided: Adjustment of device  Austin Parks [name] 05/26/2022, 2:15 PM

## 2022-05-26 NOTE — ED Notes (Signed)
Patient transported to X-ray 
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
## 2022-05-26 NOTE — ED Notes (Signed)
Called ortho for finger splint

## 2022-05-26 NOTE — ED Notes (Signed)
To CT scan - family at bedside.

## 2022-05-26 NOTE — ED Triage Notes (Signed)
Pt reports falling today and hitting his head.No LOS. Pt right index finger is dislocated. Per.Dr.Hagler pt will need to be transported via car to ED for further imaging. Pt verbalizes understanding.

## 2022-05-28 DIAGNOSIS — S63282A Dislocation of proximal interphalangeal joint of right middle finger, initial encounter: Secondary | ICD-10-CM | POA: Diagnosis not present

## 2022-05-28 DIAGNOSIS — M79641 Pain in right hand: Secondary | ICD-10-CM | POA: Diagnosis not present

## 2022-06-04 DIAGNOSIS — M79641 Pain in right hand: Secondary | ICD-10-CM | POA: Diagnosis not present

## 2022-06-18 DIAGNOSIS — S63282A Dislocation of proximal interphalangeal joint of right middle finger, initial encounter: Secondary | ICD-10-CM | POA: Diagnosis not present

## 2022-06-25 DIAGNOSIS — M79641 Pain in right hand: Secondary | ICD-10-CM | POA: Diagnosis not present

## 2022-08-23 IMAGING — CT CT HEAD W/O CM
3 of 4 series · 13 of 47 positions shown, 15 images · non-contrast
Comparison: None Available.

CLINICAL DATA: Head trauma, minor (Age >= 65y)



[Series 4: head without cor · coronal · non-contrast · 0.23mm/px · 3 of 67 slices shown]
[im 23/67  brain]
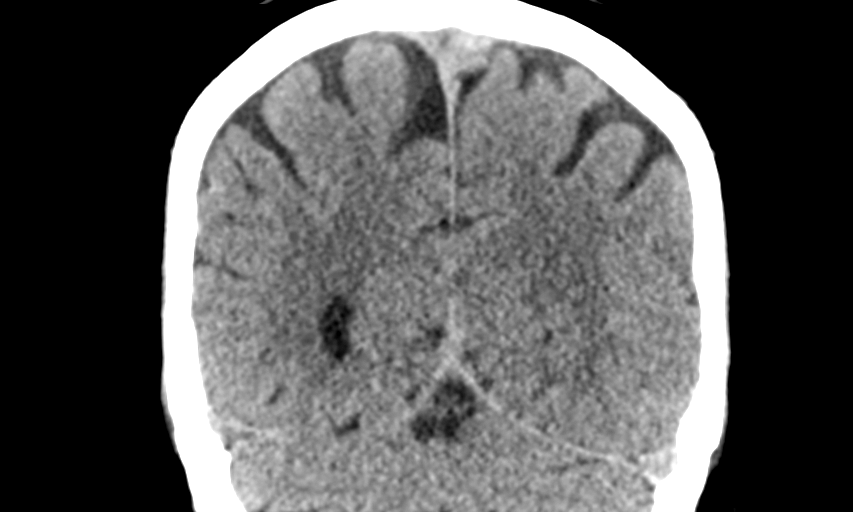
[im 30/67  brain]
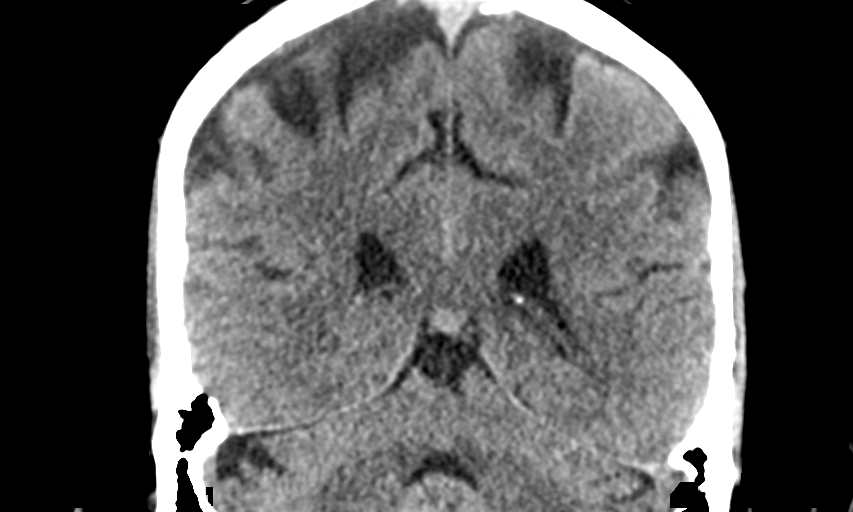
[im 37/67  brain]
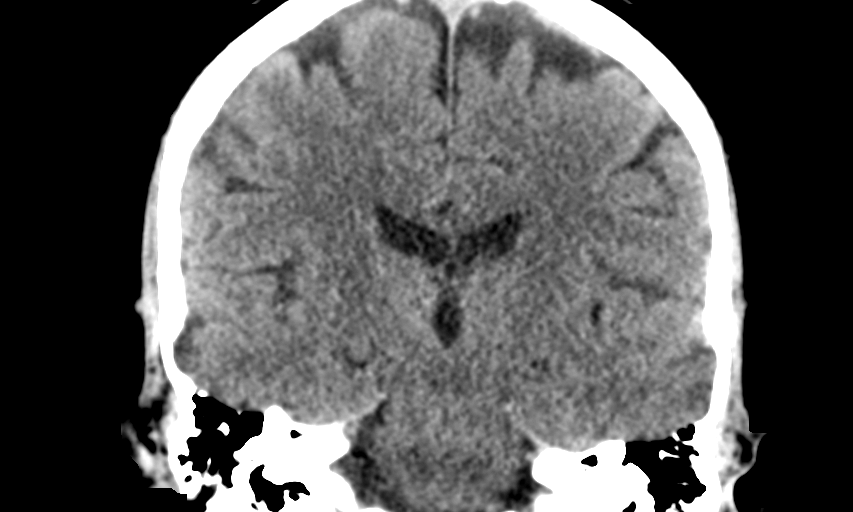

[Series 5: head without sag · sagittal · non-contrast · 0.23mm/px · 3 of 67 slices shown]
[im 23/67  brain]
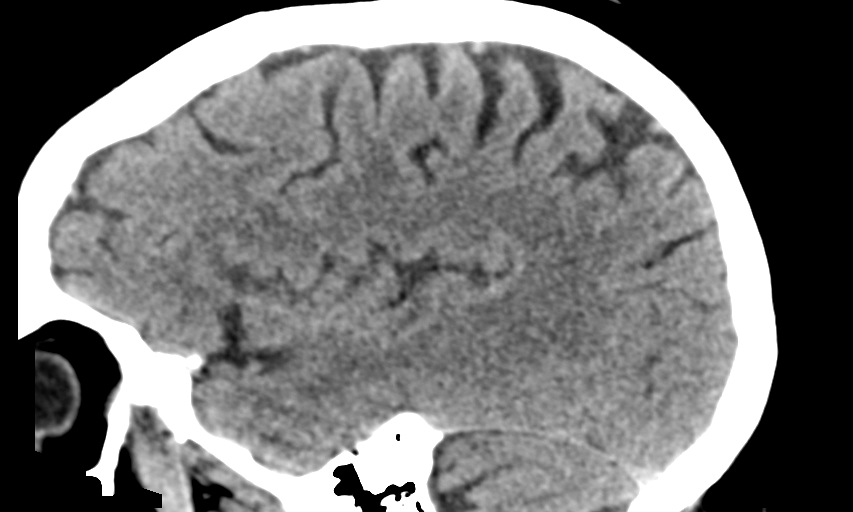
[im 34/67  brain]
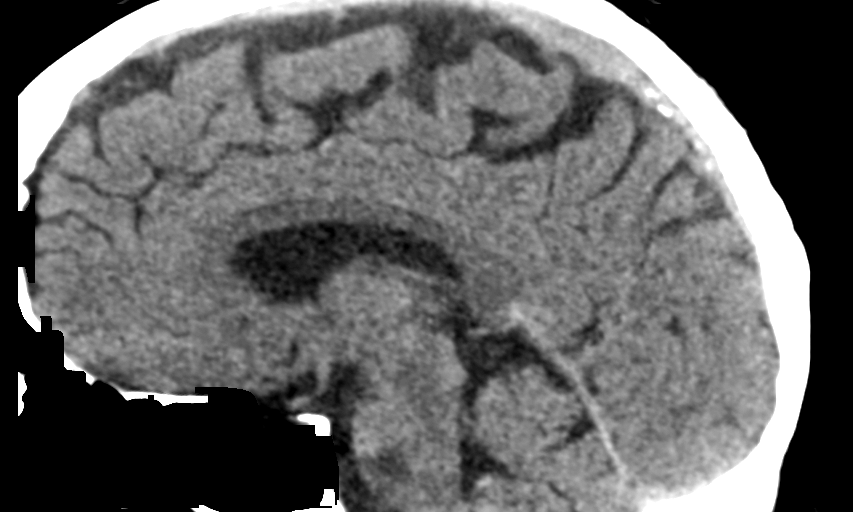
[im 45/67  brain]
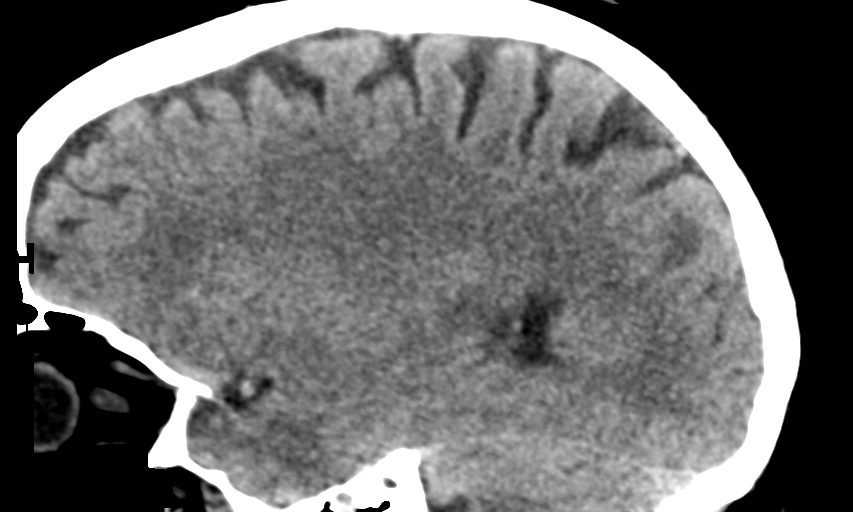

[Series 6: head without · axial · non-contrast · 0.49mm/px · z∈[-53,+77]mm · 7 of 36 slices shown, 9 images]
[im 5/36  brain]
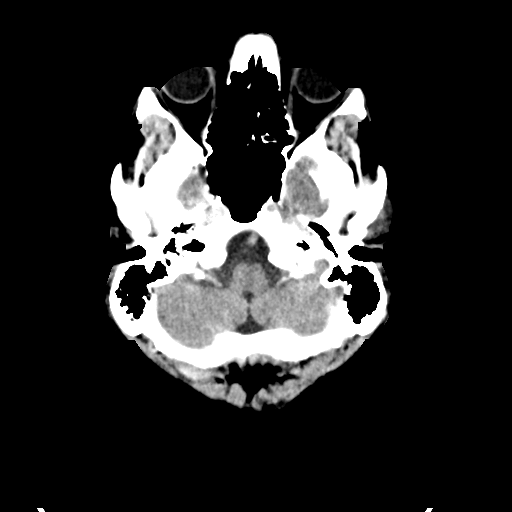
[im 5/36  bone]
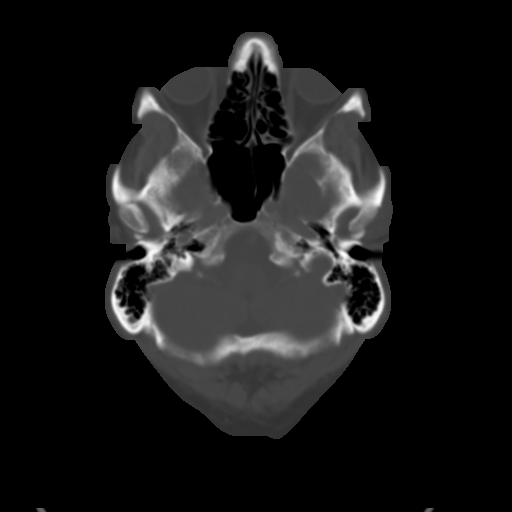
[im 9/36  brain]
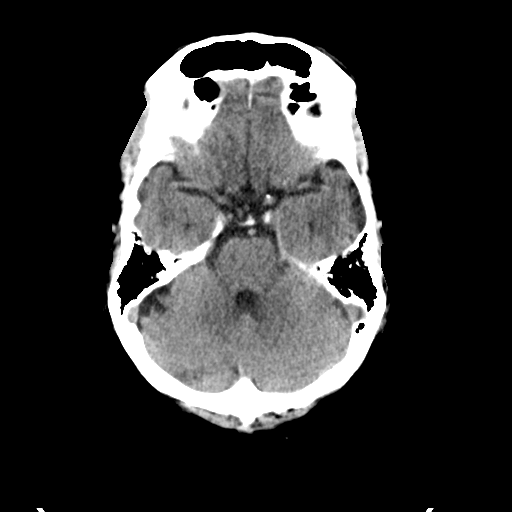
[im 14/36  brain]
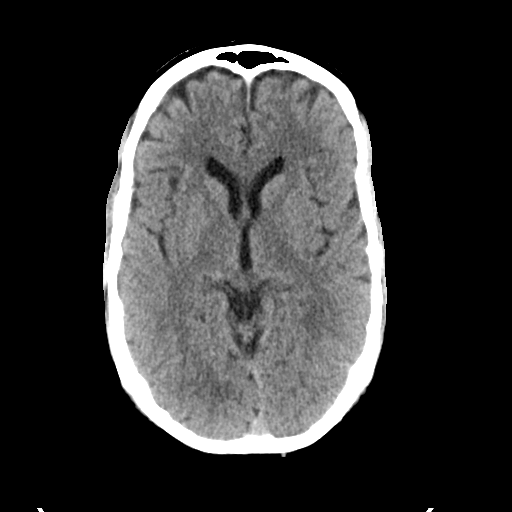
[im 18/36  brain]
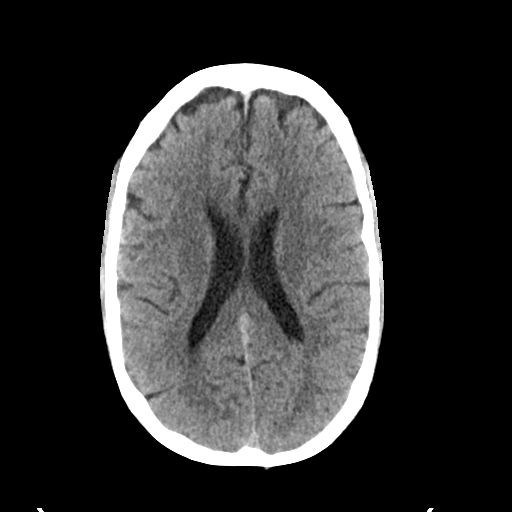
[im 22/36  brain]
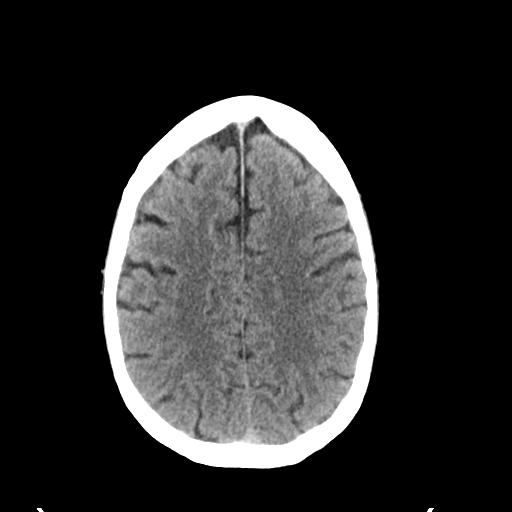
[im 22/36  bone]
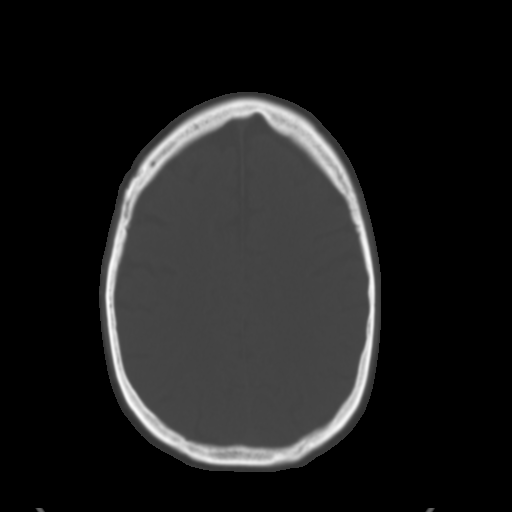
[im 27/36  brain]
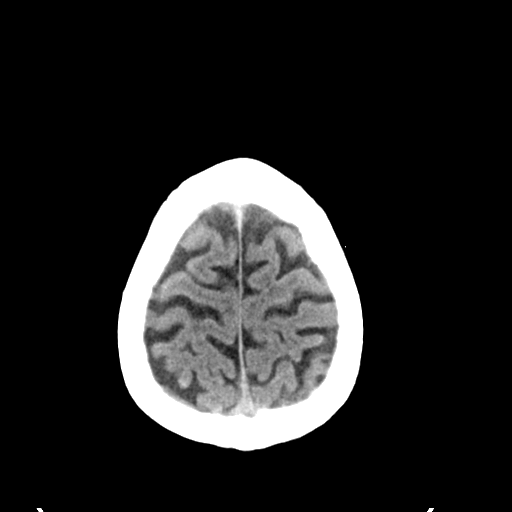
[im 31/36  brain]
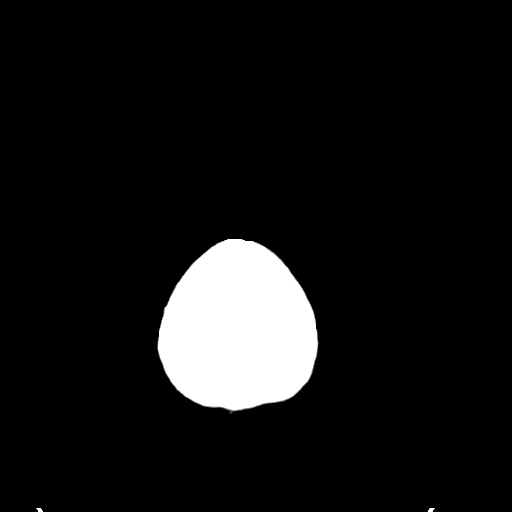

[13 of 47 positions shown; findings below may reference images not displayed]

FINDINGS: Brain: There is no acute intracranial hemorrhage, mass effect, or
edema. Gray-white differentiation is preserved. There is no
extra-axial fluid collection. Ventricles and sulci are within normal
limits in size and configuration.

Vascular: There is atherosclerotic calcification at the skull base.

Skull: Calvarium is unremarkable.

Sinuses/Orbits: No acute finding.

Other: None.
IMPRESSION: No evidence of acute intracranial injury.

## 2022-08-23 IMAGING — DX DG WRIST COMPLETE 3+V*R*
4 series · 4 of 4 positions shown · non-contrast
Comparison: None Available.

CLINICAL DATA: Right middle finger injury.  Hand pain.

EXAM:
RIGHT HAND - COMPLETE 3+ VIEW; RIGHT WRIST - COMPLETE 3+ VIEW

[wrist pa]
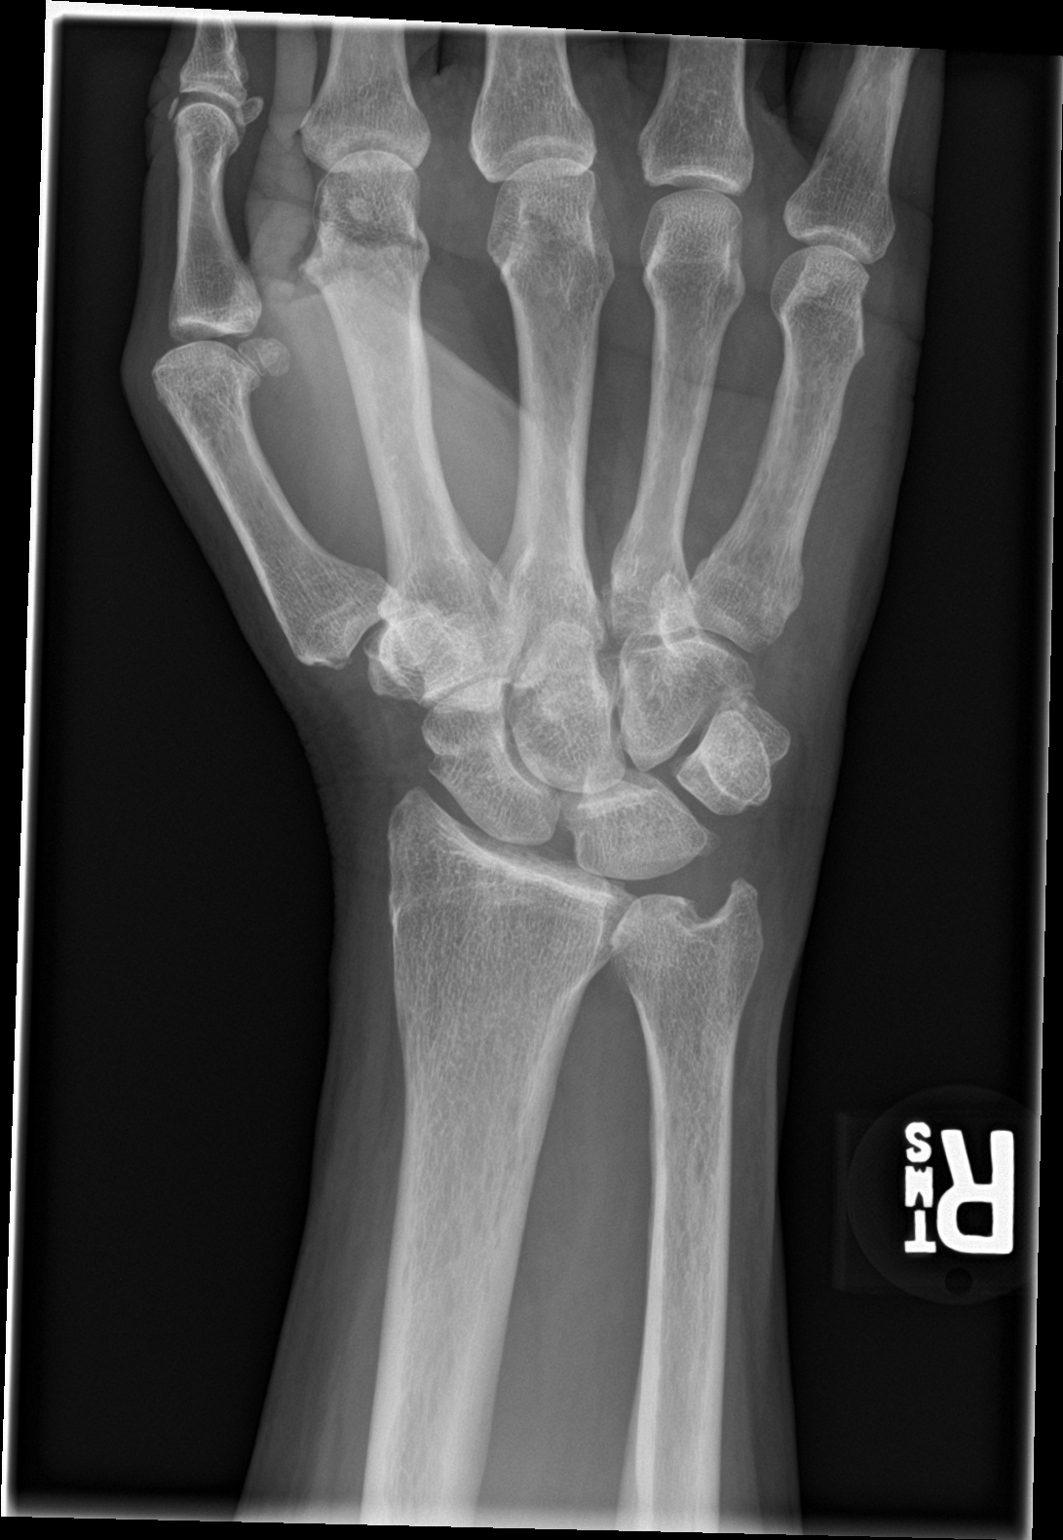

[wrist obl]
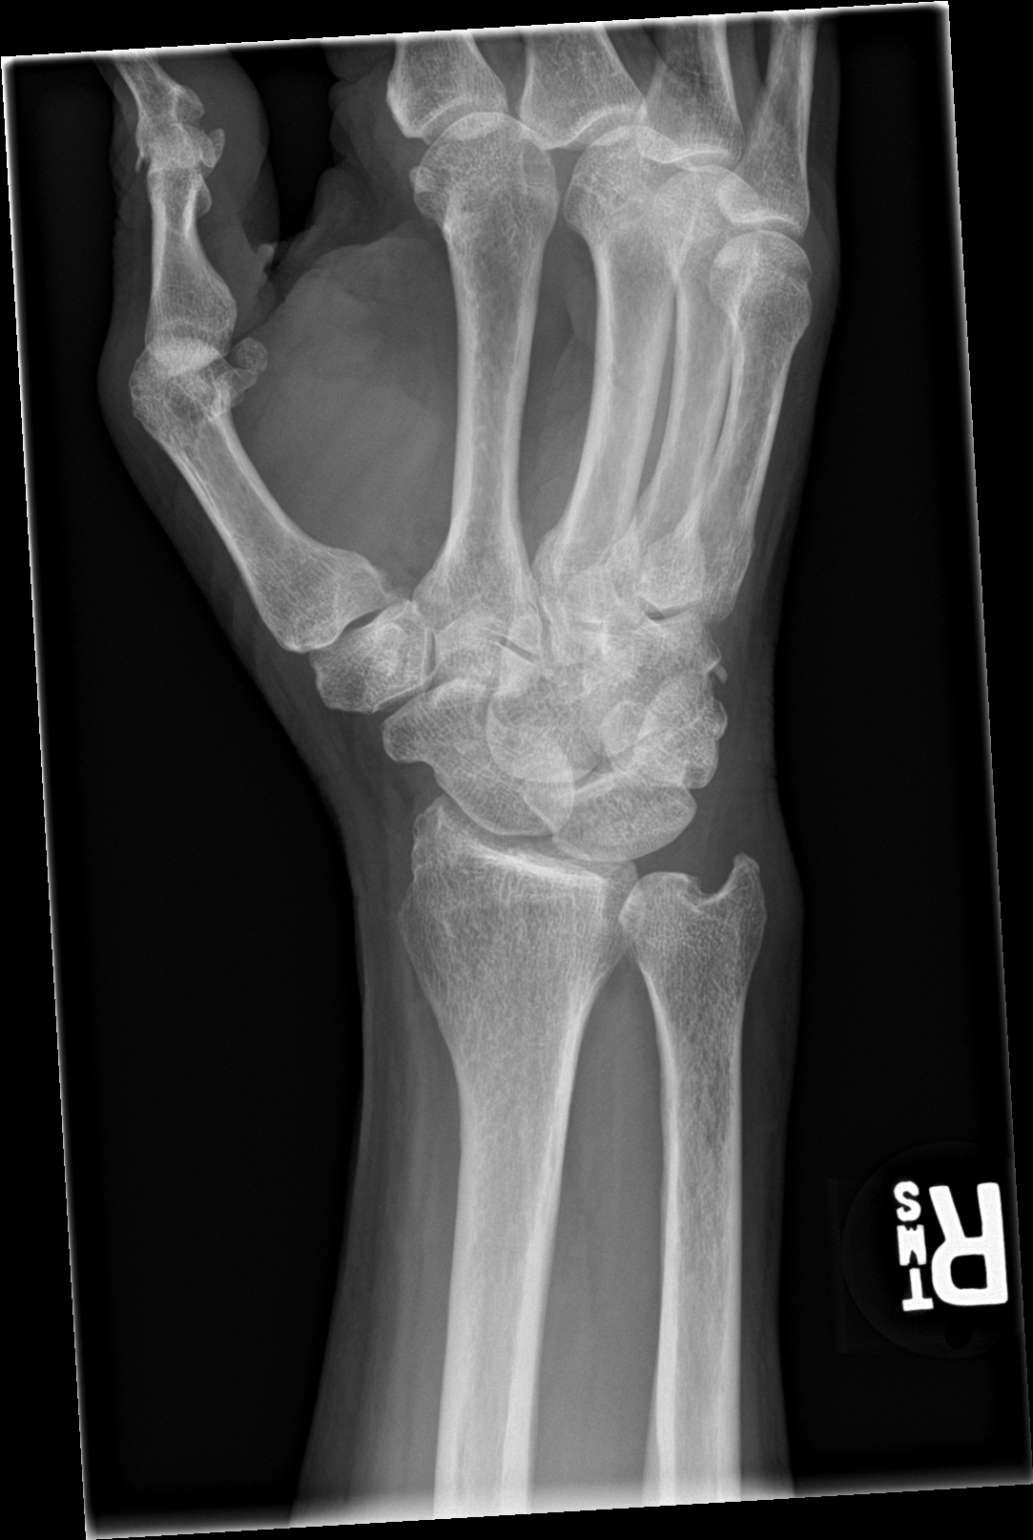

[wrist lat]
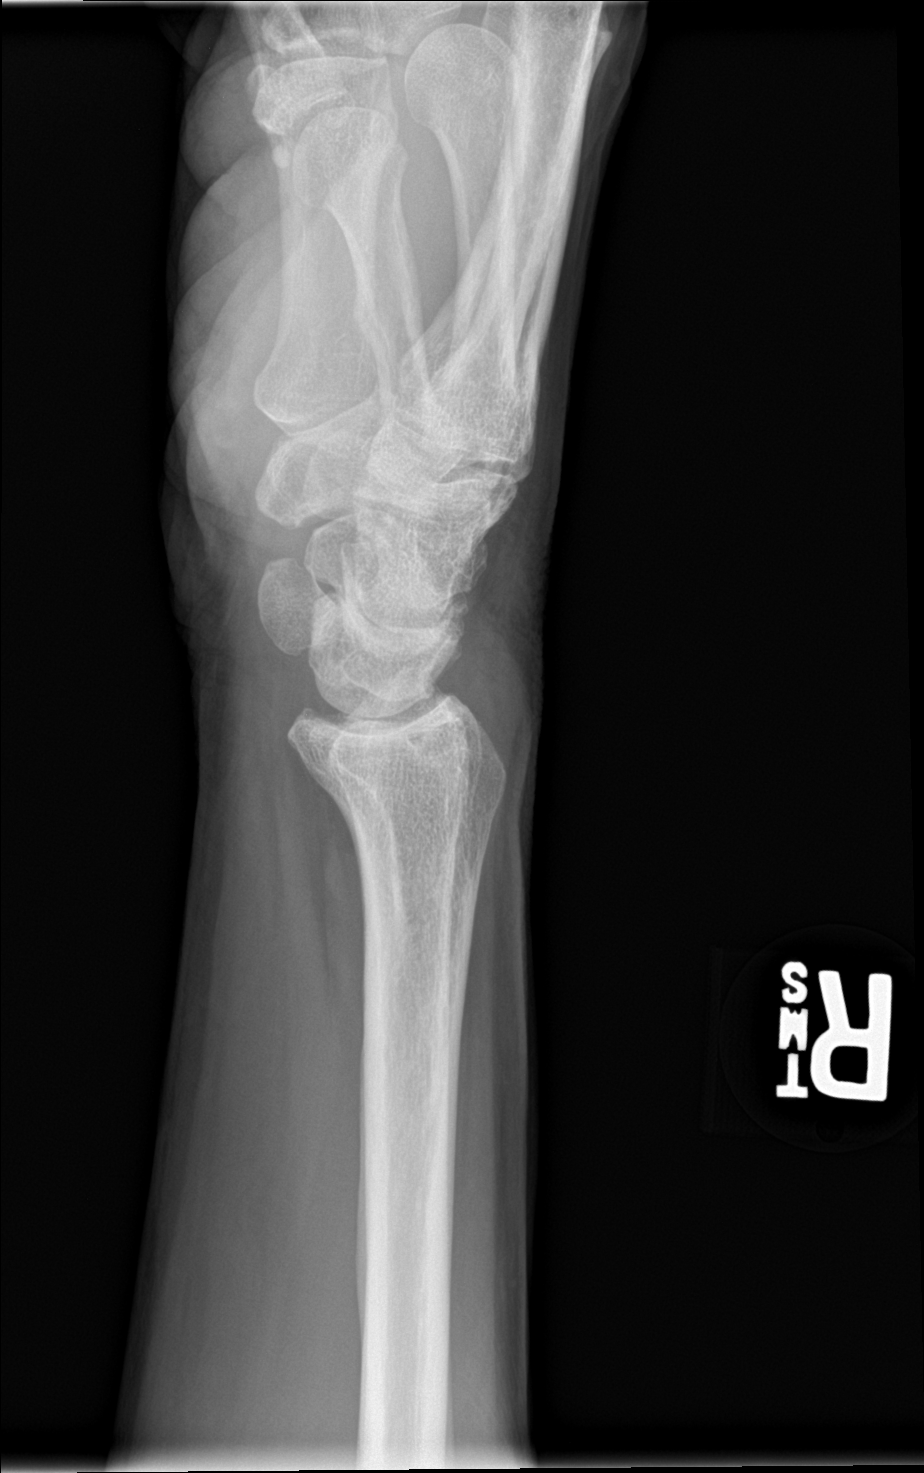

[wrist navicular]
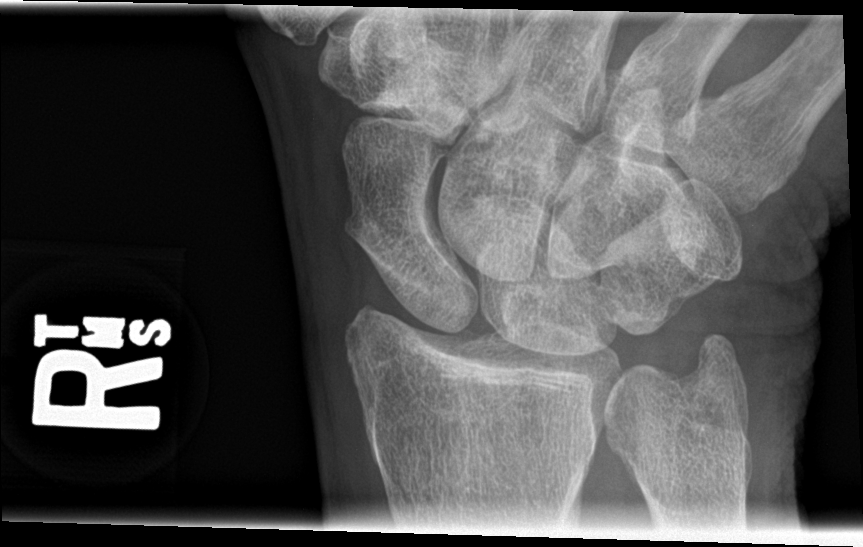

[4 of 4 positions shown; findings below may reference images not displayed]

FINDINGS: Dorsal dislocation of the third PIP joint with a displaced fracture
at the volar corner at the base of the third middle phalanx. No
aggressive osseous lesion. Normal alignment. Mild osteoarthritis of
the scaphotrapeziotrapezoid joint. Mild osteoarthritis of the first
[place] joint.

Soft tissue are unremarkable. No radiopaque foreign body or soft
tissue emphysema.
IMPRESSION: 1. Dorsal dislocation of the third PIP joint with a displaced
fracture at the volar corner at the base of the third middle
phalanx.
2.  No acute osseous injury of the right wrist.

## 2022-09-03 DIAGNOSIS — E785 Hyperlipidemia, unspecified: Secondary | ICD-10-CM | POA: Diagnosis not present

## 2022-09-03 DIAGNOSIS — R7301 Impaired fasting glucose: Secondary | ICD-10-CM | POA: Diagnosis not present

## 2022-09-03 DIAGNOSIS — L309 Dermatitis, unspecified: Secondary | ICD-10-CM | POA: Diagnosis not present

## 2022-09-03 DIAGNOSIS — I2510 Atherosclerotic heart disease of native coronary artery without angina pectoris: Secondary | ICD-10-CM | POA: Diagnosis not present

## 2022-09-03 DIAGNOSIS — I480 Paroxysmal atrial fibrillation: Secondary | ICD-10-CM | POA: Diagnosis not present

## 2022-09-03 DIAGNOSIS — C181 Malignant neoplasm of appendix: Secondary | ICD-10-CM | POA: Diagnosis not present

## 2022-09-03 DIAGNOSIS — Z23 Encounter for immunization: Secondary | ICD-10-CM | POA: Diagnosis not present

## 2022-09-03 DIAGNOSIS — N529 Male erectile dysfunction, unspecified: Secondary | ICD-10-CM | POA: Diagnosis not present

## 2022-09-04 ENCOUNTER — Emergency Department (HOSPITAL_COMMUNITY): Payer: PPO

## 2022-09-04 ENCOUNTER — Encounter (HOSPITAL_COMMUNITY): Payer: Self-pay | Admitting: Emergency Medicine

## 2022-09-04 ENCOUNTER — Emergency Department (HOSPITAL_COMMUNITY)
Admission: EM | Admit: 2022-09-04 | Discharge: 2022-09-04 | Disposition: A | Discharge status: Home / Self Care | Payer: PPO | Attending: Emergency Medicine | Admitting: Emergency Medicine

## 2022-09-04 ENCOUNTER — Other Ambulatory Visit: Payer: Self-pay

## 2022-09-04 DIAGNOSIS — I6529 Occlusion and stenosis of unspecified carotid artery: Secondary | ICD-10-CM | POA: Diagnosis not present

## 2022-09-04 DIAGNOSIS — S0990XA Unspecified injury of head, initial encounter: Secondary | ICD-10-CM | POA: Diagnosis not present

## 2022-09-04 DIAGNOSIS — Y92410 Unspecified street and highway as the place of occurrence of the external cause: Secondary | ICD-10-CM | POA: Insufficient documentation

## 2022-09-04 DIAGNOSIS — R079 Chest pain, unspecified: Secondary | ICD-10-CM | POA: Insufficient documentation

## 2022-09-04 DIAGNOSIS — R0789 Other chest pain: Secondary | ICD-10-CM | POA: Diagnosis not present

## 2022-09-04 DIAGNOSIS — S199XXA Unspecified injury of neck, initial encounter: Secondary | ICD-10-CM | POA: Diagnosis not present

## 2022-09-04 DIAGNOSIS — M4319 Spondylolisthesis, multiple sites in spine: Secondary | ICD-10-CM | POA: Diagnosis not present

## 2022-09-04 DIAGNOSIS — Z7901 Long term (current) use of anticoagulants: Secondary | ICD-10-CM | POA: Insufficient documentation

## 2022-09-04 DIAGNOSIS — I10 Essential (primary) hypertension: Secondary | ICD-10-CM | POA: Diagnosis not present

## 2022-09-04 DIAGNOSIS — I2510 Atherosclerotic heart disease of native coronary artery without angina pectoris: Secondary | ICD-10-CM | POA: Diagnosis not present

## 2022-09-04 LAB — TROPONIN I (HIGH SENSITIVITY): Troponin I (High Sensitivity): 11 ng/L (ref <18)
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
## 2022-09-04 NOTE — ED Notes (Signed)
Discharge instructions reviewed with patient. Patient verbalized understanding of instructions. Follow-up care and medications were reviewed. Patient ambulatory with steady gait. VSS upon discharge.  ?
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
## 2022-09-04 NOTE — ED Notes (Signed)
Pt ambulated to restroom without difficulty
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
## 2022-09-04 NOTE — ED Notes (Signed)
Report given to [facility]

## 2022-09-27 DIAGNOSIS — Z23 Encounter for immunization: Secondary | ICD-10-CM | POA: Diagnosis not present

## 2022-10-08 ENCOUNTER — Telehealth: Payer: Self-pay | Admitting: Internal Medicine

## 2022-10-08 NOTE — Telephone Encounter (Signed)
Pt would like a callback from nurse regarding Afib. Please advise

## 2022-10-08 NOTE — Telephone Encounter (Signed)
Spoke with pt who reports he has had an increase of Afib episodes since 09/25/2022.  He states episodes could last for one hour to several hours.  He is anticoagulated with Eliquis.  He monitors his Afib with his watch and a Kardia mobile device.  Pt states HR is usually in the 60's when in Afib.  He denies current symptoms of CP, SOB or dizziness.  He states at his last appointment with Dr Caryl Comes it was recommended he see Afib ablation and pt would like to schedule an appointment with one of Dr Olin Pia associates.  This information was verified per Dr Aquilla Hacker previous note from 11/2021.  Appointment scheduled with Dr Myles Gip for 10/21/2022 at 1045.  Pt verbalizes understanding and agrees with current plan.

## 2022-10-17 DIAGNOSIS — H5203 Hypermetropia, bilateral: Secondary | ICD-10-CM | POA: Diagnosis not present

## 2022-10-17 DIAGNOSIS — H2513 Age-related nuclear cataract, bilateral: Secondary | ICD-10-CM | POA: Diagnosis not present

## 2022-10-17 DIAGNOSIS — H524 Presbyopia: Secondary | ICD-10-CM | POA: Diagnosis not present

## 2022-10-21 ENCOUNTER — Ambulatory Visit: Payer: PPO | Attending: Cardiovascular Disease | Admitting: Cardiovascular Disease

## 2022-10-21 ENCOUNTER — Encounter: Payer: Self-pay | Admitting: Cardiovascular Disease

## 2022-10-21 ENCOUNTER — Ambulatory Visit: Payer: PPO | Attending: Cardiovascular Disease

## 2022-10-21 VITALS — BP 118/62 | HR 41 | Ht 73.0 in | Wt 197.0 lb

## 2022-10-21 DIAGNOSIS — I483 Typical atrial flutter: Secondary | ICD-10-CM

## 2022-10-21 DIAGNOSIS — C181 Malignant neoplasm of appendix: Secondary | ICD-10-CM | POA: Insufficient documentation

## 2022-10-21 NOTE — Patient Instructions (Signed)
Medication Instructions:  Your physician recommends that you continue on your current medications as directed. Please refer to the Current Medication list given to you today.  *If you need a refill on your cardiac medications before your next appointment, please call your pharmacy*  Testing/Procedures: Your physician has recommended that you wear an event monitor. Event monitors are medical devices that record the heart's electrical activity. Doctors most often [place] these monitors to diagnose arrhythmias. Arrhythmias are problems with the speed or rhythm of the heartbeat. The monitor is a small, portable device. You can wear one while you do your normal daily activities. This is usually used to diagnose what is causing palpitations/syncope (passing out).  Follow-Up: At [facility], you and your health needs are our priority.  As part of our continuing mission to provide you with exceptional heart care, we have created designated Provider Care Teams.  These Care Teams include your primary Cardiologist (physician) and Advanced Practice Providers (APPs -  Physician Assistants and Nurse Practitioners) who all work together to provide you with the care you need, when you need it.  Your next appointment:   2 month(s)  The format for your next appointment:   In Person  Provider:   Doralee Albino, MD  Other Instructions Bryn Gulling- Long Term Monitor Instructions  Your physician has requested you wear a ZIO patch monitor for 14 days.  This is a single patch monitor. [employer] supplies one patch monitor per enrollment. Additional stickers are not available. Please do not apply patch if you will be having a Nuclear Stress Test,  Echocardiogram, Cardiac CT, MRI, or Chest Xray during the period you would be wearing the  monitor. The patch cannot be worn during these tests. You cannot remove and re-apply the  ZIO XT patch monitor.  Your ZIO patch monitor will be mailed 3 day USPS to your address on  file. It may take 3-5 days  to receive your monitor after you have been enrolled.  Once you have received your monitor, please review the enclosed instructions. Your monitor  has already been registered assigning a specific monitor serial # to you.  Billing and Patient Assistance Program Information  We have supplied [employer] with any of your insurance information on file for billing purposes. [employer] offers a sliding scale Patient Assistance Program for patients that do not have  insurance, or whose insurance does not completely cover the cost of the ZIO monitor.  You must apply for the Patient Assistance Program to qualify for this discounted rate.  To apply, please call [employer] at [phone], select option 4, select option 2, ask to apply for  Patient Assistance Program. Theodore Demark will ask your household income, and how many people  are in your household. They will quote your out-of-pocket cost based on that information.  [employer] will also be able to set up a 77-month interest-free payment plan if needed.  Applying the monitor   Shave hair from upper left chest.  Hold abrader disc by orange tab. Rub abrader in 40 strokes over the upper left chest as  indicated in your monitor instructions.  Clean area with 4 enclosed alcohol pads. Let dry.  Apply patch as indicated in monitor instructions. Patch will be placed under collarbone on left  side of chest with arrow pointing upward.  Rub patch adhesive wings for 2 minutes. Remove white label marked "1". Remove the white  label marked "2". Rub patch adhesive wings for 2 additional minutes.  While looking in a  mirror, press and release button in center of patch. A small green light will  flash 3-4 times. This will be your only indicator that the monitor has been turned on.  Do not shower for the first 24 hours. You may shower after the first 24 hours.  Press the button if you feel a symptom. You will hear a small click. Record Date, Time and   Symptom in the Patient Logbook.  When you are ready to remove the patch, follow instructions on the last 2 pages of Patient  Logbook. Stick patch monitor onto the last page of Patient Logbook.  Place Patient Logbook in the blue and white box. Use locking tab on box and tape box closed  securely. The blue and white box has prepaid postage on it. Please place it in the mailbox as  soon as possible. Your physician should have your test results approximately 7 days after the  monitor has been mailed back to [facility].  Call [facility] at [phone] if you have questions regarding  your ZIO XT patch monitor. Call them immediately if you see an orange light blinking on your  monitor.  If your monitor falls off in less than 4 days, contact our Monitor department at [phone].  If your monitor becomes loose or falls off after 4 days call [employer] at [phone] for  suggestions on securing your monitor   Important Information About Sugar

## 2022-10-21 NOTE — Progress Notes (Signed)
Cardiology Office Note:    Date:  10/21/2022   ID:  Austin Parks, DOB 03-09-1946, MRN [id number]  PCP:  [name], [place] Prov[facility] Cardiologist:  None Electrophysiologist:  [name], MD     Referring MD: [name], MD   Chief complaint: here for arrhythmia follow-up  History of Present Illness:    Austin Parks is a 76 y.o. male with a hx of hyperlipidemia, hypertension and additional issues detailed below referred for arrhythmia management.   He was last seen in our clinic by Dr. Caryl Comes in December, 2022. The patient underwent EP testing and ablation of an atypical atrial flutter in Sept 2017. He returned the following year with palpitations and recordings of tachycardia from an [place].  A Holter monitor then showed nonsustained atrial tachycardia and PVCs.  An event recorder was placed in August 2022 that showed atrial high rate episodes, but the longest episode was only a few seconds.  I reviewed multiple tracings from his home rhythm monitor.  Many of these are labeled as possible atrial fibrillation, but P waves can clearly be seen indicating sinus rhythm with frequent PACs.  His symptoms are very mild.  He gets a sense of fullness in his throat.  He does not have significant palpitations, chest pain, dependent edema, syncope, presyncope.  Past Medical History:  Diagnosis Date   Atrial tachycardia    Back pain, chronic    Benign prostatic hypertrophy    with obstruction   Bradycardia    ectopic   Erectile dysfunction of organic origin    Gastroesophageal reflux disease    Hypercholesterolemia    borderline. admitted january 2011 for chest pain-all stress related it was felt to be a-flutter 7/17   Mild aortic insufficiency    Mild mitral regurgitation    Premature atrial contractions    PVC's (premature ventricular contractions)    Typical atrial flutter (HCC)    a. reverse typical atrial flutter s/p ablation 08/2016.     Past Surgical History:  Procedure Laterality Date   ABLATION OF DYSRHYTHMIC FOCUS  08/22/2016   APPENDECTOMY     ELECTROPHYSIOLOGIC STUDY N/A 08/22/2016   Procedure: A-Flutter Ablation;  Surgeon: [name], MD;  Location: [facility];  Service: Cardiovascular;  Laterality: N/A;   TONSILECTOMY/ADENOIDECTOMY WITH MYRINGOTOMY  [place]  08/2013   Dr. [name]    Current Medications: Current Meds  Medication Sig   cholecalciferol (VITAMIN D) 1000 units tablet Take 2,000 Units by mouth daily.   Coenzyme Q10 200 MG capsule Take 200 mg by mouth daily.    ELIQUIS 5 MG TABS tablet Take 5 mg by mouth 2 (two) times daily.   ezetimibe (ZETIA) 10 MG tablet Take 10 mg by mouth daily.   Multiple Vitamins-Minerals (MULTIVITAMIN ADULT PO) Take 1 tablet by mouth daily. Centrum Silver Senior for men   rosuvastatin (CRESTOR) 20 MG tablet Take 1 tablet by mouth daily.   vitamin C (ASCORBIC ACID) 500 MG tablet Take 500 mg by mouth daily.     Allergies:   Celecoxib   Social History   Socioeconomic History   Marital status: Married    Spouse name: Not on file   Number of children: 3   Years of education: college   Highest education level: Not on file  Occupational History   Occupation: VP of Psychologist, educational at [employer].  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 He lives with his 2nd wife of 30+ years (prior marriage lasted 3 years) and has 2 children. Patient denies history of alcohol and tobacco use. [name] and [name] children. [name] is peds resident at [facility] as of 2011) retired 2/12. In 2016 still working some. Part time job at [employer] and [employer].   Social Determinants of Health   Financial Resource Strain: Not on file  Food Insecurity: Not on file  Transportation Needs: Not on file  Physical Activity: Not on file  Stress: Not on file  Social Connections: Not on file     Family History: The patient's family history includes Brain cancer in his mother; Diabetes Mellitus I in his daughter; Diabetes Mellitus II in his paternal aunt; Lung cancer in his mother; Stomach cancer (age of onset: 27) in his father. There is no history of Colon polyps, Colon cancer, Esophageal cancer, or Rectal cancer.  ROS:   Please see the history of present illness.    All other systems reviewed and are negative.  EKGs/Labs/Other Studies Reviewed:     EKG:  Last EKG results: today - sinus rhythm with frequent PACs   Recent Labs: No results found for requested labs within last 365 days.     Physical Exam:    VS:  BP 118/62   Pulse (!) 41   Ht '6\' 1"'$  (1.854 m)   Wt 197 lb (89.4 kg)   SpO2 96%   BMI 25.99 kg/m     Wt Readings from Last 3 Encounters:  10/21/22 197 lb (89.4 kg)  09/04/22 194 lb (88 kg)  05/26/22 206 lb (93.4 kg)     GEN:  Well nourished, well developed in no acute distress CARDIAC: Irregular rhythm, no murmurs, rubs, gallops RESPIRATORY:  Normal work of breathing MUSCULOSKELETAL: no edema    ASSESSMENT & PLAN:    Atrial flutter: s/p ablation in 2017 without documentation of recurrence.  Frequent atrial ectopy, atrial runs. Causing R-R irregularity misinterpreted as AF. I am going to place a 14 day monitor. In the absence of significant bradycardia, will consider starting low dose betablocker. Secondary hypercoagulable state: anticoagulation is indicated with a diagnosis of atrial flutter, and his risk of AF is high given his history and ectopy burden. CHADS2Vasc is 3. I will not Dc apixaban. History of bradycardia: I suspect this is apical dissociation -- he does not have a strong pulse  with closely coupled PACs. Will evaluate with monitor        Medication Adjustments/Labs and Tests Ordered: Current medicines are reviewed at length with the patient today.  Concerns regarding medicines are outlined above.  No orders of the defined types were placed in this encounter.  No orders of the defined types were placed in this encounter.    Signed, Melida Quitter, MD  10/21/2022 11:02 AM    [place]

## 2022-10-21 NOTE — Progress Notes (Unsigned)
Enrolled patient for a 14 day Zio XT  monitor to be mailed to patients home  °

## 2022-10-24 DIAGNOSIS — I483 Typical atrial flutter: Secondary | ICD-10-CM | POA: Diagnosis not present

## 2022-11-06 ENCOUNTER — Ambulatory Visit: Payer: PPO | Admitting: Physician Assistant

## 2022-11-14 DIAGNOSIS — I483 Typical atrial flutter: Secondary | ICD-10-CM | POA: Diagnosis not present

## 2022-11-19 DIAGNOSIS — R978 Other abnormal tumor markers: Secondary | ICD-10-CM | POA: Diagnosis not present

## 2022-11-19 DIAGNOSIS — C181 Malignant neoplasm of appendix: Secondary | ICD-10-CM | POA: Diagnosis not present

## 2022-11-19 DIAGNOSIS — D490 Neoplasm of unspecified behavior of digestive system: Secondary | ICD-10-CM | POA: Diagnosis not present

## 2022-11-26 ENCOUNTER — Encounter: Payer: Self-pay | Admitting: Cardiovascular Disease

## 2022-12-17 ENCOUNTER — Ambulatory Visit: Payer: PPO | Admitting: Cardiovascular Disease

## 2023-01-01 ENCOUNTER — Encounter: Payer: Self-pay | Admitting: Cardiovascular Disease

## 2023-01-01 ENCOUNTER — Ambulatory Visit: Payer: PPO | Attending: Cardiovascular Disease | Admitting: Cardiovascular Disease

## 2023-01-01 VITALS — BP 130/80 | HR 61 | Ht 73.0 in | Wt 198.0 lb

## 2023-01-01 DIAGNOSIS — I4892 Unspecified atrial flutter: Secondary | ICD-10-CM | POA: Diagnosis not present

## 2023-01-01 MED ORDER — METOPROLOL SUCCINATE ER 25 MG PO TB24: 12.5000 mg | ORAL_TABLET | Freq: Every day | ORAL | 3 refills | Status: DC

## 2023-01-01 NOTE — Progress Notes (Signed)
Cardiology Office Note:    Date:  01/01/2023   ID:  Austin Parks, DOB 11-26-1946, MRN [id number]  PCP:  [name], [place] Providers Cardiologist:  None Electrophysiologist:  [name], MD     Referring MD: [name], MD   Chief complaint: here for arrhythmia follow-up  History of Present Illness:    Austin Parks is a 77 y.o. male with a hx of hyperlipidemia, hypertension and additional issues detailed below referred for arrhythmia management.   He was last seen in our clinic by Dr. Caryl Comes in December, 2022. The patient underwent EP testing and ablation of an atypical atrial flutter in Sept 2017. He returned the following year with palpitations and recordings of tachycardia from an [employer].  A Holter monitor then showed nonsustained atrial tachycardia and PVCs.  An event recorder was placed in August 2022 that showed atrial high rate episodes, but the longest episode was only a few seconds.  I reviewed multiple tracings from his home rhythm monitor.  Many of these are labeled as possible atrial fibrillation, but P waves can clearly be seen indicating sinus rhythm with frequent PACs.  His symptoms are very mild.  He gets a sense of fullness in his throat.  He does not have significant palpitations, chest pain, dependent edema, syncope, presyncope.  Past Medical History:  Diagnosis Date   Atrial tachycardia    Back pain, chronic    Benign prostatic hypertrophy    with obstruction   Bradycardia    ectopic   Erectile dysfunction of organic origin    Gastroesophageal reflux disease    Hypercholesterolemia    borderline. admitted january 2011 for chest pain-all stress related it was felt to be a-flutter 7/17   Mild aortic insufficiency    Mild mitral regurgitation    Premature atrial contractions    PVC's (premature ventricular contractions)    Typical atrial flutter (HCC)    a. reverse typical atrial flutter s/p ablation 08/2016.     Past Surgical History:  Procedure Laterality Date   ABLATION OF DYSRHYTHMIC FOCUS  08/22/2016   APPENDECTOMY     ELECTROPHYSIOLOGIC STUDY N/A 08/22/2016   Procedure: A-Flutter Ablation;  Surgeon: [name], MD;  Location: [facility];  Service: Cardiovascular;  Laterality: N/A;   TONSILECTOMY/ADENOIDECTOMY WITH MYRINGOTOMY  [place]  08/2013   Dr. [name]    Current Medications: No outpatient medications have been marked as taking for the 01/01/23 encounter (Appointment) with Jacobs Golab, Yetta Barre, MD.     Allergies:   Celecoxib   Social History   Socioeconomic History   Marital status: Married    Spouse name: Not on file   Number of children: 3   Years of education: college   Highest education level: Not on file  Occupational History   Occupation: VP of Psychologist, educational at [employer].  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 He lives with his 2nd wife of 30+ years (prior marriage lasted 3 years) and has 2 children. Patient denies history of alcohol and  tobacco use. [name] and [name] children. [name] is peds resident at [facility] as of 2011) retired 2/12. In 2016 still working some. Part time job at [employer] and [employer].   Social Determinants of Health   Financial Resource Strain: Not on file  Food Insecurity: Not on file  Transportation Needs: Not on file  Physical Activity: Not on file  Stress: Not on file  Social Connections: Not on file     Family History: The patient's family history includes Brain cancer in his mother; Diabetes Mellitus I in his daughter; Diabetes Mellitus II in his paternal aunt;  Lung cancer in his mother; Stomach cancer (age of onset: 56) in his father. There is no history of Colon polyps, Colon cancer, Esophageal cancer, or Rectal cancer.  ROS:   Please see the history of present illness.    All other systems reviewed and are negative.  EKGs/Labs/Other Studies Reviewed:    Monitor: 12/04/2022 Sinus rhythm HR 50-117, avg 72 bpm Atrial fibrillation burden 2%, HR 53-159, avg 76, longest duration 2 hr 16 min There was frequent supraventricular ectopy comprising 16.3% of beats Patient triggered events correlated with supraventricular ectopy  EKG:  Last EKG results: today - sinus rhythm with frequent PACs   Recent Labs: No results found for requested labs within last 365 days.     Physical Exam:    VS:  There were no vitals taken for this visit.    Wt Readings from Last 3 Encounters:  10/21/22 197 lb (89.4 kg)  09/04/22 194 lb (88 kg)  05/26/22 206 lb (93.4 kg)     GEN:  Well nourished, well developed in no acute distress CARDIAC: Irregular rhythm, no murmurs, rubs, gallops RESPIRATORY:  Normal work of breathing MUSCULOSKELETAL: no edema    ASSESSMENT & PLAN:    Atrial fibrillation: AF documented on monitor 11/2022. Burden is low at 2%. No obvious symptoms. Rate is controlled Atrial flutter: s/p ablation in 2017  Frequent atrial ectopy, atrial runs. Causing R-R irregularity misinterpreted as AF. Will start metoprolol succinate 12.5 mg daily (his wife takes the same medication and dose) Secondary hypercoagulable state: continue apixaban History of bradycardia: I suspect this is apical dissociation -- he does not have a strong pulse with closely coupled PACs. Will evaluate with monitor        Medication Adjustments/Labs and Tests Ordered: Current medicines are reviewed at length with the patient today.  Concerns regarding medicines are outlined above.  No orders of the defined types were placed in this encounter.  No orders of the defined  types were placed in this encounter.    Signed, Melida Quitter, MD  01/01/2023 8:34 AM    [place]

## 2023-01-01 NOTE — Patient Instructions (Signed)
Medication Instructions:  Your physician has recommended you make the following change in your medication:   1) START metoprolol succinate (Toprol XL) 12.'5mg'$  daily (you will take a half of a '25mg'$  tablet).  *If you need a refill on your cardiac medications before your next appointment, please call your pharmacy*  Lab Work: NONE  Testing/Procedures: NONE  Follow-Up: At [facility], you and your health needs are our priority.  As part of our continuing mission to provide you with exceptional heart care, we have created designated Provider Care Teams.  These Care Teams include your primary Cardiologist (physician) and Advanced Practice Providers (APPs -  Physician Assistants and Nurse Practitioners) who all work together to provide you with the care you need, when you need it.  Your next appointment:   1 year(s)  Provider:   Doralee Albino, MD

## 2023-01-21 DIAGNOSIS — I480 Paroxysmal atrial fibrillation: Secondary | ICD-10-CM | POA: Diagnosis not present

## 2023-01-21 DIAGNOSIS — J0100 Acute maxillary sinusitis, unspecified: Secondary | ICD-10-CM | POA: Diagnosis not present

## 2023-01-21 DIAGNOSIS — R0981 Nasal congestion: Secondary | ICD-10-CM | POA: Diagnosis not present

## 2023-01-29 DIAGNOSIS — N529 Male erectile dysfunction, unspecified: Secondary | ICD-10-CM | POA: Diagnosis not present

## 2023-01-29 DIAGNOSIS — R7301 Impaired fasting glucose: Secondary | ICD-10-CM | POA: Diagnosis not present

## 2023-01-29 DIAGNOSIS — I2510 Atherosclerotic heart disease of native coronary artery without angina pectoris: Secondary | ICD-10-CM | POA: Diagnosis not present

## 2023-01-29 DIAGNOSIS — Z125 Encounter for screening for malignant neoplasm of prostate: Secondary | ICD-10-CM | POA: Diagnosis not present

## 2023-01-29 DIAGNOSIS — E785 Hyperlipidemia, unspecified: Secondary | ICD-10-CM | POA: Diagnosis not present

## 2023-01-29 DIAGNOSIS — K219 Gastro-esophageal reflux disease without esophagitis: Secondary | ICD-10-CM | POA: Diagnosis not present

## 2023-02-05 DIAGNOSIS — Z1331 Encounter for screening for depression: Secondary | ICD-10-CM | POA: Diagnosis not present

## 2023-02-05 DIAGNOSIS — R82998 Other abnormal findings in urine: Secondary | ICD-10-CM | POA: Diagnosis not present

## 2023-02-05 DIAGNOSIS — I4892 Unspecified atrial flutter: Secondary | ICD-10-CM | POA: Diagnosis not present

## 2023-02-05 DIAGNOSIS — I480 Paroxysmal atrial fibrillation: Secondary | ICD-10-CM | POA: Diagnosis not present

## 2023-02-05 DIAGNOSIS — R7301 Impaired fasting glucose: Secondary | ICD-10-CM | POA: Diagnosis not present

## 2023-02-05 DIAGNOSIS — Z0000 Encounter for general adult medical examination without abnormal findings: Secondary | ICD-10-CM | POA: Diagnosis not present

## 2023-02-05 DIAGNOSIS — E785 Hyperlipidemia, unspecified: Secondary | ICD-10-CM | POA: Diagnosis not present

## 2023-02-05 DIAGNOSIS — N401 Enlarged prostate with lower urinary tract symptoms: Secondary | ICD-10-CM | POA: Diagnosis not present

## 2023-02-05 DIAGNOSIS — I2510 Atherosclerotic heart disease of native coronary artery without angina pectoris: Secondary | ICD-10-CM | POA: Diagnosis not present

## 2023-04-30 ENCOUNTER — Emergency Department (HOSPITAL_COMMUNITY)
Admission: EM | Admit: 2023-04-30 | Discharge: 2023-04-30 | Disposition: A | Discharge status: Home / Self Care | Payer: PPO | Attending: Emergency Medicine | Admitting: Emergency Medicine

## 2023-04-30 ENCOUNTER — Other Ambulatory Visit: Payer: Self-pay

## 2023-04-30 DIAGNOSIS — S91012A Laceration without foreign body, left ankle, initial encounter: Secondary | ICD-10-CM | POA: Diagnosis not present

## 2023-04-30 DIAGNOSIS — Y92007 Garden or yard of unspecified non-institutional (private) residence as the place of occurrence of the external cause: Secondary | ICD-10-CM | POA: Diagnosis not present

## 2023-04-30 DIAGNOSIS — S91002A Unspecified open wound, left ankle, initial encounter: Secondary | ICD-10-CM | POA: Diagnosis not present

## 2023-04-30 DIAGNOSIS — Y9389 Activity, other specified: Secondary | ICD-10-CM | POA: Insufficient documentation

## 2023-04-30 DIAGNOSIS — W228XXA Striking against or struck by other objects, initial encounter: Secondary | ICD-10-CM | POA: Insufficient documentation

## 2023-04-30 DIAGNOSIS — I4891 Unspecified atrial fibrillation: Secondary | ICD-10-CM | POA: Insufficient documentation

## 2023-04-30 DIAGNOSIS — S99912A Unspecified injury of left ankle, initial encounter: Secondary | ICD-10-CM | POA: Diagnosis present

## 2023-04-30 DIAGNOSIS — Z7901 Long term (current) use of anticoagulants: Secondary | ICD-10-CM | POA: Insufficient documentation

## 2023-04-30 LAB — CBC WITH DIFFERENTIAL/PLATELET
Abs Immature Granulocytes: 0.01 10*3/uL (ref 0.00–0.07)
Basophils Absolute: 0 10*3/uL (ref 0.0–0.1)
Basophils Relative: 1 %
Eosinophils Absolute: 0.2 10*3/uL (ref 0.0–0.5)
Eosinophils Relative: 3 %
HCT: 37.9 % — ABNORMAL LOW (ref 39.0–52.0)
Hemoglobin: 12.4 g/dL — ABNORMAL LOW (ref 13.0–17.0)
Immature Granulocytes: 0 %
Lymphocytes Relative: 25 %
Lymphs Abs: 1.3 10*3/uL (ref 0.7–4.0)
MCH: 29.2 pg (ref 26.0–34.0)
MCHC: 32.7 g/dL (ref 30.0–36.0)
MCV: 89.2 fL (ref 80.0–100.0)
Monocytes Absolute: 0.4 10*3/uL (ref 0.1–1.0)
Monocytes Relative: 8 %
Neutro Abs: 3.4 10*3/uL (ref 1.7–7.7)
Neutrophils Relative %: 63 %
Platelets: 204 10*3/uL (ref 150–400)
RBC: 4.25 MIL/uL (ref 4.22–5.81)
RDW: 12.7 % (ref 11.5–15.5)
WBC: 5.3 10*3/uL (ref 4.0–10.5)
nRBC: 0 % (ref 0.0–0.2)

## 2023-04-30 MED ORDER — LIDOCAINE HCL 1 % IJ SOLN
INTRAMUSCULAR | Status: AC
  Filled 2023-04-30: qty 20

## 2023-04-30 MED ORDER — LIDOCAINE HCL (PF) 1 % IJ SOLN: 5.0000 mL | Freq: Once | INTRAMUSCULAR | Status: DC

## 2023-04-30 MED ORDER — LIDOCAINE-EPINEPHRINE 2 %-1:100000 IJ SOLN: 5.0000 mL | Freq: Once | INTRAMUSCULAR | Status: DC

## 2023-04-30 MED ORDER — LIDOCAINE HCL (PF) 1 % IJ SOLN
5.0000 mL | Freq: Once | INTRAMUSCULAR | Status: AC
  Administered 2023-04-30: 5 mL via INTRADERMAL

## 2023-04-30 NOTE — ED Triage Notes (Addendum)
Lac to left ankle pt states he was out doing yard work and noticed bleeding unsure on what hit his ankle. Pt takes Eliquis.

## 2023-04-30 NOTE — Discharge Instructions (Signed)
Today we placed sutures over your blood blister, to help with primary bleeding.  You should have them removed in 2 to 3 days.  We placed them so it would not rebleed.  You should wait 24 hours before getting the sutures wet, and get them properly removed in about 2 to 3 days.  Return to the ER if you have persistent bleeding, redness in the area, or swelling.  Keep the area clean and dry.

## 2023-04-30 NOTE — ED Provider Notes (Signed)
[facility] DEPART[facility] AT [facility] Provider Note   CSN: [id number] Arrival date & time: 04/30/23  1428     History  Chief Complaint  Patient presents with   Extremity Laceration    Austin Parks is a 77 y.o. male, history of A-fib, on Eliquis, who presents to the left ankle bleeding after a blood clot in his leg, he states that he may begin to bleed.  States that he is stop bleeding.  Denies any lightheadedness, pain to the area.  Home Medications Prior to Admission medications   Medication Sig Start Date End Date Taking? Authorizing Provider  cholecalciferol (VITAMIN D) 1000 units tablet Take 2,000 Units by mouth daily.    [provider]  Coenzyme Q10 200 MG capsule Take 200 mg by mouth daily.     [provider]  ELIQUIS 5 MG TABS tablet Take 5 mg by mouth 2 (two) times daily. 07/21/22   [provider]  ezetimibe (ZETIA) 10 MG tablet Take 10 mg by mouth daily.    [provider]  metoprolol succinate (TOPROL XL) 25 MG 24 hr tablet Take 0.5 tablets (12.5 mg total) by mouth daily. 01/01/23   Mealor, Roberts Gaudy, MD  Multiple Vitamins-Minerals (MULTIVITAMIN ADULT PO) Take 1 tablet by mouth daily. Centrum Silver Senior for men    [provider]  rosuvastatin (CRESTOR) 20 MG tablet Take 1 tablet by mouth daily. 06/05/14   [provider]  vitamin C (ASCORBIC ACID) 500 MG tablet Take 500 mg by mouth daily.    [provider]      Allergies    Celecoxib    Review of Systems   Review of Systems  Respiratory:  Negative for shortness of breath.   Skin:  Positive for wound.  Neurological:  Negative for dizziness.    Physical Exam Updated Vital Signs BP (!) 150/98 (BP Location: Right Arm)   Pulse 74   Temp 98.3 F (36.8 C) (Oral)   Resp 16   Ht 6\' 1"  (1.854 m)   Wt 90 kg   SpO2 98%   BMI 26.18 kg/m  Physical Exam Vitals and nursing note reviewed.  Constitutional:      General: He is  not in acute distress.    Appearance: He is well-developed.  HENT:     Head: Normocephalic and atraumatic.  Eyes:     Conjunctiva/sclera: Conjunctivae normal.  Cardiovascular:     Rate and Rhythm: Normal rate and regular rhythm.     Heart sounds: No murmur heard. Pulmonary:     Effort: Pulmonary effort is normal. No respiratory distress.     Breath sounds: Normal breath sounds.  Abdominal:     Palpations: Abdomen is soft.     Tenderness: There is no abdominal tenderness.  Musculoskeletal:        General: No swelling.     Cervical back: Neck supple.  Skin:    General: Skin is warm and dry.     Capillary Refill: Capillary refill takes less than 2 seconds.     Comments: +blood blister, non-bleeding to L medial ankle  Neurological:     Mental Status: He is alert.  Psychiatric:        Mood and Affect: Mood normal.     ED Results / Procedures / Treatments   Labs (all labs ordered are listed, but only abnormal results are displayed) Labs Reviewed  CBC WITH DIFFERENTIAL/PLATELET - Abnormal; Notable for the following components:  Result Value   Hemoglobin 12.4 (*)    HCT 37.9 (*)    All other components within normal limits    EKG None  Radiology No results found.  Procedures .Marland KitchenLaceration Repair  Date/Time: 04/30/2023 3:59 PM  Performed by: Pete Pelt, PA Authorized by: Pete Pelt, PA   Consent:    Consent obtained:  Verbal   Consent given by:  Patient   Risks, benefits, and alternatives were discussed: yes     Risks discussed:  Infection, pain, retained foreign body, need for additional repair, poor cosmetic result, tendon damage, nerve damage, poor wound healing and vascular damage   Alternatives discussed:  Observation Universal protocol:    Patient identity confirmed:  Verbally with patient Anesthesia:    Anesthesia method:  Local infiltration   Local anesthetic:  Lidocaine 1% w/o epi Laceration details:    Location: L medial ankle.   Length  (cm):  1 Pre-procedure details:    Preparation:  Patient was prepped and draped in usual sterile fashion Treatment:    Area cleansed with:  Chlorhexidine   Amount of cleaning:  Standard   Irrigation solution:  Sterile saline   Irrigation method:  Syringe Skin repair:    Repair method:  Sutures   Suture size:  3-0   Suture material:  Prolene   Suture technique:  Simple interrupted (crossing)   Number of sutures:  2 Approximation:    Approximation:  Close Repair type:    Repair type:  Simple Post-procedure details:    Dressing:  Bulky dressing   Procedure completion:  Tolerated     Medications Ordered in ED Medications  lidocaine (XYLOCAINE) 1 % (with pres) injection (  Not Given 04/30/23 1636)  lidocaine (PF) (XYLOCAINE) 1 % injection 5 mL (5 mLs Intradermal Given by Other 04/30/23 1636)    ED Course/ Medical Decision Making/ A&P                             Medical Decision Making Discussed with Dr. Particia Nearing, we discussed burning possibly, versus putting stitches over the area, to help with hemostasis of the blister to help prevent rebleed.  We discussed likely cross-stitch will be most helpful, to help briefly.  Placed 2 sutures, will need to have removed in about 3-4 days. Will obtain CBC to verify no drop in hemoglobin.   Amount and/or Complexity of Data Reviewed Labs: ordered.    Details: Hgb 12.4 Discussion of management or test interpretation with external provider(s): Hemoglobin stable at 12.4, wound healed nicely, good hemostasis after cross-stitch applied.  Discussed follow-up with PCP in 2 to 3 days for suture removal.  Sutures placed predominantly to help prevent rebleed.  Wound is not deep, and occurred just by sloughing off of blood blister, thus no tetanus at this time, given.  Discussed with patient, follow-up with primary care doctor, and he voiced understanding.  Risk Prescription drug management.    Final Clinical Impression(s) / ED Diagnoses Final  diagnoses:  Wound of left ankle, initial encounter  Long term current use of anticoagulant    Rx / DC Orders ED Discharge Orders     None         Ashanty Coltrane, [place]ley [place], PA 04/30/23 1650    Jacalyn Lefevre, MD 04/30/23 2244

## 2023-05-08 ENCOUNTER — Telehealth: Payer: Self-pay

## 2023-05-08 NOTE — Telephone Encounter (Signed)
Transition Care Management Follow-up Telephone Call Date of discharge and from where: 04/30/2023 [facility]. How have you been since you were released from the hospital? Patient stated he is feeling better. Any questions or concerns? No  Items Reviewed: Did the pt receive and understand the discharge instructions provided? Yes  Medications obtained and verified? Yes  Other? No  Any new allergies since your discharge? No  Dietary orders reviewed? Yes Do you have support at home? Yes   Follow up appointments reviewed:  PCP Hospital f/u appt confirmed? No  Scheduled to see  on  @ . Specialist Hospital f/u appt confirmed? No  Scheduled to see  on  @ . Are transportation arrangements needed? No  If their condition worsens, is the pt aware to call PCP or go to the Emergency Dept.? Yes Was the patient provided with contact information for the PCP's office or ED? Yes Was to pt encouraged to call back with questions or concerns? Yes  Joliana [facility]  [facility] Population Health Community Resource Care Guide   [email or web address]  ?? [phone]   Website: [email or web address]

## 2023-05-13 DIAGNOSIS — L42 Pityriasis rosea: Secondary | ICD-10-CM | POA: Diagnosis not present

## 2023-05-13 DIAGNOSIS — L308 Other specified dermatitis: Secondary | ICD-10-CM | POA: Diagnosis not present

## 2023-05-13 DIAGNOSIS — L821 Other seborrheic keratosis: Secondary | ICD-10-CM | POA: Diagnosis not present

## 2023-05-15 DIAGNOSIS — D490 Neoplasm of unspecified behavior of digestive system: Secondary | ICD-10-CM | POA: Diagnosis not present

## 2023-05-15 DIAGNOSIS — D379 Neoplasm of uncertain behavior of digestive organ, unspecified: Secondary | ICD-10-CM | POA: Diagnosis not present

## 2023-05-15 DIAGNOSIS — C181 Malignant neoplasm of appendix: Secondary | ICD-10-CM | POA: Diagnosis not present

## 2023-05-28 DIAGNOSIS — D1809 Hemangioma of other sites: Secondary | ICD-10-CM | POA: Diagnosis not present

## 2023-05-28 DIAGNOSIS — D1801 Hemangioma of skin and subcutaneous tissue: Secondary | ICD-10-CM | POA: Diagnosis not present

## 2023-07-01 DIAGNOSIS — M79645 Pain in left finger(s): Secondary | ICD-10-CM | POA: Diagnosis not present

## 2023-07-31 DIAGNOSIS — Z9049 Acquired absence of other specified parts of digestive tract: Secondary | ICD-10-CM | POA: Diagnosis not present

## 2023-07-31 DIAGNOSIS — Z981 Arthrodesis status: Secondary | ICD-10-CM | POA: Diagnosis not present

## 2023-07-31 DIAGNOSIS — Z85038 Personal history of other malignant neoplasm of large intestine: Secondary | ICD-10-CM | POA: Diagnosis not present

## 2023-07-31 DIAGNOSIS — I4892 Unspecified atrial flutter: Secondary | ICD-10-CM | POA: Diagnosis not present

## 2023-07-31 DIAGNOSIS — K648 Other hemorrhoids: Secondary | ICD-10-CM | POA: Diagnosis not present

## 2023-07-31 DIAGNOSIS — Z980 Intestinal bypass and anastomosis status: Secondary | ICD-10-CM | POA: Diagnosis not present

## 2023-07-31 DIAGNOSIS — Z1211 Encounter for screening for malignant neoplasm of colon: Secondary | ICD-10-CM | POA: Diagnosis not present

## 2023-07-31 DIAGNOSIS — E7800 Pure hypercholesterolemia, unspecified: Secondary | ICD-10-CM | POA: Diagnosis not present

## 2023-07-31 DIAGNOSIS — K5730 Diverticulosis of large intestine without perforation or abscess without bleeding: Secondary | ICD-10-CM | POA: Diagnosis not present

## 2023-07-31 DIAGNOSIS — Z7901 Long term (current) use of anticoagulants: Secondary | ICD-10-CM | POA: Diagnosis not present

## 2023-10-03 DIAGNOSIS — Z23 Encounter for immunization: Secondary | ICD-10-CM | POA: Diagnosis not present

## 2023-11-09 DIAGNOSIS — H2513 Age-related nuclear cataract, bilateral: Secondary | ICD-10-CM | POA: Diagnosis not present

## 2023-11-09 DIAGNOSIS — H5203 Hypermetropia, bilateral: Secondary | ICD-10-CM | POA: Diagnosis not present

## 2023-11-13 DIAGNOSIS — D379 Neoplasm of uncertain behavior of digestive organ, unspecified: Secondary | ICD-10-CM | POA: Diagnosis not present

## 2023-11-13 DIAGNOSIS — C181 Malignant neoplasm of appendix: Secondary | ICD-10-CM | POA: Diagnosis not present

## 2023-11-13 DIAGNOSIS — D490 Neoplasm of unspecified behavior of digestive system: Secondary | ICD-10-CM | POA: Diagnosis not present

## 2023-11-13 DIAGNOSIS — Z8509 Personal history of malignant neoplasm of other digestive organs: Secondary | ICD-10-CM | POA: Diagnosis not present

## 2023-12-26 ENCOUNTER — Other Ambulatory Visit: Payer: Self-pay | Admitting: Cardiovascular Disease

## 2024-02-29 ENCOUNTER — Ambulatory Visit: Payer: PPO | Attending: Cardiovascular Disease | Admitting: Cardiovascular Disease

## 2024-02-29 ENCOUNTER — Encounter: Payer: Self-pay | Admitting: Cardiovascular Disease

## 2024-02-29 DIAGNOSIS — I483 Typical atrial flutter: Secondary | ICD-10-CM | POA: Diagnosis not present

## 2024-02-29 DIAGNOSIS — I4719 Other supraventricular tachycardia: Secondary | ICD-10-CM

## 2024-02-29 DIAGNOSIS — I491 Atrial premature depolarization: Secondary | ICD-10-CM | POA: Diagnosis not present

## 2024-02-29 NOTE — Progress Notes (Signed)
 Cardiology Office Note:    Date:  02/29/2024   ID:  Hondo Nanda, DOB [birth year], MRN [id number]  PCP:  Rodrigo Ran, MD    [facility]Care Providers Cardiologist:  None Electrophysiologist:  Sherryl Manges, MD     Referring MD: Rodrigo Ran, MD   Chief complaint: here for arrhythmia follow-up  History of Present Illness:    Austin Parks is a 78 y.o. male with a hx of hyperlipidemia, hypertension and additional issues detailed below referred for arrhythmia management.   He was last seen in our clinic by Dr. Graciela Husbands in December, 2022. The patient underwent EP testing and ablation of an atypical atrial flutter in Sept 2017. He returned the following year with palpitations and recordings of tachycardia from an [employer] Watch.  A Holter monitor then showed nonsustained atrial tachycardia and PVCs.  An event recorder was placed in August 2022 that showed atrial high rate episodes, but the longest episode was only a few seconds.  I reviewed multiple tracings from his home rhythm monitor.  Many of these are labeled as possible atrial fibrillation, but P waves can clearly be seen indicating sinus rhythm with frequent PACs.  His symptoms are very mild.  He gets a sense of fullness in his throat.  He does not have significant palpitations, chest pain, dependent edema, syncope, presyncope.    EKGs/Labs/Other Studies Reviewed:    Monitor: 12/04/2022 Sinus rhythm HR 50-117, avg 72 bpm Atrial fibrillation burden 2%, HR 53-159, avg 76, longest duration 2 hr 16 min There was frequent supraventricular ectopy comprising 16.3% of beats Patient triggered events correlated with supraventricular ectopy  EKG:  Last EKG results: today - sinus rhythm with frequent PACs   Recent Labs: 04/30/2023: Hemoglobin 12.4; Platelets 204     Physical Exam:    VS:  BP 130/70 (BP Location: Left Arm, Patient Position: Sitting, Cuff Size: Large)   Pulse 68   Ht 6\' 1"  (1.854 m)   Wt 201 lb (91.2 kg)    SpO2 96%   BMI 26.52 kg/m     Wt Readings from Last 3 Encounters:  02/29/24 201 lb (91.2 kg)  04/30/23 198 lb 6.6 oz (90 kg)  01/01/23 198 lb (89.8 kg)     GEN:  Well nourished, well developed in no acute distress CARDIAC: Irregular rhythm, no murmurs, rubs, gallops RESPIRATORY:  Normal work of breathing MUSCULOSKELETAL: no edema    ASSESSMENT & PLAN:    Atrial fibrillation:  AF documented on monitor 11/2022.  Burden is low at 2%.  No obvious symptoms.  Rate is controlled We discussed management options.  Using a shared decision approach, we decided to proceed with ablation at the time of atrial flutter ablation.  We discussed the indication, rationale, logistics, anticipated benefits, and potential risks of the ablation procedure including but not limited to -- bleed at the groin access site, chest pain, damage to nearby organs such as the diaphragm, lungs, or esophagus, need for a drainage tube, or prolonged hospitalization. I explained that the risk for stroke, heart attack, need for open chest surgery, or even death is very low but not zero. he  expressed understanding and wishes to proceed.   Atrial flutter:  s/p ablation in 2017  EKG today shows recurrence of flutter We discussed management options and we will plan to schedule mapping and repeat ablation of flutter. Rate is currently controlled with regular 4:1 conduction.  Secondary hypercoagulable state:  continue apixaban  History of bradycardia:  I suspect this  is apical dissociation -- he does not have a strong pulse with closely coupled PACs. Will evaluate with monitor        Medication Adjustments/Labs and Tests Ordered: Current medicines are reviewed at length with the patient today.  Concerns regarding medicines are outlined above.  Orders Placed This Encounter  Procedures   EKG 12-Lead   No orders of the defined types were placed in this encounter.    Signed, Maurice Small, MD  02/29/2024  11:24 AM    [facility]

## 2024-02-29 NOTE — Addendum Note (Signed)
 Addended by: Sherle Poe R on: 02/29/2024 11:56 AM   Modules accepted: Orders

## 2024-02-29 NOTE — Patient Instructions (Addendum)
 Medication Instructions:  Your physician recommends that you continue on your current medications as directed. Please refer to the Current Medication list given to you today. *If you need a refill on your cardiac medications before your next appointment, please call your pharmacy*   Lab Work: CBC and BMET - Please have pre-procedural lab work completed on Monday, April 14 at [facility] near you. No appointment required and this does not need to be fasting If you have labs (blood work) drawn today and your tests are completely normal, you will receive your results only by: MyChart Message (if you have MyChart) OR A paper copy in the mail If you have any lab test that is abnormal or we need to change your treatment, we will call you to review the results.   Testing/Procedures: Cardiac CT - our CT scheduler will contact you to set this up Your physician has requested that you have cardiac CT. Cardiac computed tomography (CT) is a painless test that uses an x-ray machine to take clear, detailed pictures of your heart. For further information please visit [email or web address] Please follow instruction sheet as given.  Atrial Fibrillation Ablation - scheduled for Wednesday, May 14 Your physician has recommended that you have an ablation. Catheter ablation is a medical procedure used to treat some cardiac arrhythmias (irregular heartbeats). During catheter ablation, a long, thin, flexible tube is put into a blood vessel in your groin (upper thigh), or neck. This tube is called an ablation catheter. It is then guided to your heart through the blood vessel. Radio frequency waves destroy small areas of heart tissue where abnormal heartbeats may cause an arrhythmia to start. Please see the instruction sheet given to you today.   Follow-Up: At [facility], you and your health needs are our priority.  As part of our continuing mission to provide you with exceptional heart care, we have created  designated Provider Care Teams.  These Care Teams include your primary Cardiologist (physician) and Advanced Practice Providers (APPs -  Physician Assistants and Nurse Practitioners) who all work together to provide you with the care you need, when you need it.  We recommend signing up for the patient portal called "MyChart".  Sign up information is provided on this After Visit Summary.  MyChart is used to connect with patients for Virtual Visits (Telemedicine).  Patients are able to view lab/test results, encounter notes, upcoming appointments, etc.  Non-urgent messages can be sent to your provider as well.   To learn more about what you can do with MyChart, go to [email or web address].    Your next appointment:   We will schedule follow up after your ablation   Provider:   York Pellant, MD

## 2024-03-02 ENCOUNTER — Ambulatory Visit (INDEPENDENT_AMBULATORY_CARE_PROVIDER_SITE_OTHER)

## 2024-03-02 ENCOUNTER — Encounter: Payer: Self-pay | Admitting: Podiatry

## 2024-03-02 ENCOUNTER — Ambulatory Visit: Payer: Self-pay | Admitting: Podiatry

## 2024-03-02 DIAGNOSIS — M19072 Primary osteoarthritis, left ankle and foot: Secondary | ICD-10-CM | POA: Diagnosis not present

## 2024-03-02 DIAGNOSIS — M19071 Primary osteoarthritis, right ankle and foot: Secondary | ICD-10-CM | POA: Diagnosis not present

## 2024-03-02 DIAGNOSIS — M79672 Pain in left foot: Secondary | ICD-10-CM

## 2024-03-02 DIAGNOSIS — M79671 Pain in right foot: Secondary | ICD-10-CM | POA: Diagnosis not present

## 2024-03-02 NOTE — Progress Notes (Signed)
Chief Complaint  Patient presents with   Foot Pain    Patient states that he has bilateral foot pain in bilateral feet, the pain is from the arch to top of his bilateral hallux's and dorsal feet. The pain is more throbbing and it starts after walking a lot, Patient does not take any medication for pain.    HPI: 78 y.o. male presenting today as a new patient for evaluation of bilateral foot pain and stiffness.  Gradual onset over the last 1-2 years.  He is concerned for possible arthritis.  He states that his feet are stiff and achy especially in the mornings when getting out of bed.  After that he is able to warm them up and they feel better.  No history of injury.  He has not anything for treatment  Past Medical History:  Diagnosis Date   Atrial tachycardia (HCC)    Back pain, chronic    Benign prostatic hypertrophy    with obstruction   Bradycardia    ectopic   Erectile dysfunction of organic origin    Gastroesophageal reflux disease    Hypercholesterolemia    borderline. admitted january 2011 for chest pain-all stress related it was felt to be a-flutter 7/17   Mild aortic insufficiency    Mild mitral regurgitation    Premature atrial contractions    PVC's (premature ventricular contractions)    Typical atrial flutter (HCC)    a. reverse typical atrial flutter s/p ablation 08/2016.    Past Surgical History:  Procedure Laterality Date   ABLATION OF DYSRHYTHMIC FOCUS  08/22/2016   APPENDECTOMY     ELECTROPHYSIOLOGIC STUDY N/A 08/22/2016   Procedure: A-Flutter Ablation;  Surgeon: Duke Salvia, MD;  Location: [facility];  Service: Cardiovascular;  Laterality: N/A;   TONSILECTOMY/ADENOIDECTOMY WITH MYRINGOTOMY  1967   TRICEPS TENDON REPAIR  08/2013   Dr. Ophelia Charter    Allergies  Allergen Reactions   Celecoxib     Other reaction(s): Other (See Comments) Per his doctor's instructions not to take it.     Physical Exam: General: The patient is alert and oriented x3 in  no acute distress.  Dermatology: Skin is warm, dry and supple bilateral lower extremities.   Vascular: Palpable pedal pulses bilaterally. Capillary refill within normal limits.  No appreciable edema.  No erythema.  Neurological: Grossly intact via light touch  Musculoskeletal Exam: High arches noted bilateral feet.  Mild tenderness with palpation throughout the midtarsal joint bilateral  Radiographic Exam B/L feet 312 2025:  Normal osseous mineralization.  There is some degenerative changes noted throughout the midtarsal joints and TMT bilateral midfoot.  No acute fractures identified.  Assessment/Plan of Care: 1.  Arthritis bilateral midfoot  -Patient evaluated.  X-rays reviewed -Discussed the findings of arthritis and conservative treatment options.  For now the patient will wear good supportive tennis shoes and sneakers. -Ultimately declined injection or any prescribed inflammatory -Advised against going barefoot. -Return to clinic as needed       Felecia Shelling, DPM [facility]  Dr. Felecia Shelling, DPM    [address], [place] [address]                Office [phone]  Fax [phone]     

## 2024-03-03 ENCOUNTER — Emergency Department (HOSPITAL_COMMUNITY)
Admission: EM | Admit: 2024-03-03 | Discharge: 2024-03-03 | Disposition: A | Discharge status: Home / Self Care | Attending: Emergency Medicine | Admitting: Emergency Medicine

## 2024-03-03 ENCOUNTER — Encounter (HOSPITAL_COMMUNITY): Payer: Self-pay | Admitting: Emergency Medicine

## 2024-03-03 ENCOUNTER — Other Ambulatory Visit: Payer: Self-pay

## 2024-03-03 DIAGNOSIS — S81812A Laceration without foreign body, left lower leg, initial encounter: Secondary | ICD-10-CM | POA: Diagnosis not present

## 2024-03-03 DIAGNOSIS — X58XXXA Exposure to other specified factors, initial encounter: Secondary | ICD-10-CM | POA: Insufficient documentation

## 2024-03-03 DIAGNOSIS — I8392 Asymptomatic varicose veins of left lower extremity: Secondary | ICD-10-CM | POA: Diagnosis not present

## 2024-03-03 DIAGNOSIS — Z7901 Long term (current) use of anticoagulants: Secondary | ICD-10-CM | POA: Diagnosis not present

## 2024-03-03 DIAGNOSIS — S8992XA Unspecified injury of left lower leg, initial encounter: Secondary | ICD-10-CM | POA: Diagnosis present

## 2024-03-03 DIAGNOSIS — Z79899 Other long term (current) drug therapy: Secondary | ICD-10-CM | POA: Insufficient documentation

## 2024-03-03 DIAGNOSIS — I83899 Varicose veins of unspecified lower extremities with other complications: Secondary | ICD-10-CM

## 2024-03-03 DIAGNOSIS — I83892 Varicose veins of left lower extremities with other complications: Secondary | ICD-10-CM | POA: Diagnosis not present

## 2024-03-03 MED ORDER — LIDOCAINE-EPINEPHRINE (PF) 2 %-1:200000 IJ SOLN
10.0000 mL | Freq: Once | INTRAMUSCULAR | Status: AC
  Administered 2024-03-03: 10 mL
  Filled 2024-03-03: qty 20

## 2024-03-03 NOTE — ED Provider Notes (Signed)
 [place] EMERGENCY DEPARTMENT AT [facility] Provider Note   CSN: [id number] Arrival date & time: 03/03/24  1603    History  Chief Complaint  Patient presents with   Laceration    Austin Parks is a 78 y.o. male history as a flutter on Eliquis here for evaluation of bleeding to his left ankle.  He was outside working in the yard.  The next day he came in and he was having persistent nonpulsatile bleeding from the inner aspect of his left ankle.  He does not member hitting this area.  He states he has had bleeding varicose veins previously and this was similar.  His mother put in an over-the-counter clotting agent onto and placed a pressure dressing and came here.  No lightheadedness or dizziness.  No numbness, weakness.  Ambulatory here.  HPI     Home Medications Prior to Admission medications   Medication Sig Start Date End Date Taking? Authorizing Provider  cholecalciferol (VITAMIN D) 1000 units tablet Take 2,000 Units by mouth daily.    [provider]  Coenzyme Q10 200 MG capsule Take 200 mg by mouth daily.     [provider]  ELIQUIS 5 MG TABS tablet Take 5 mg by mouth 2 (two) times daily. 07/21/22   [provider]  ezetimibe (ZETIA) 10 MG tablet Take 10 mg by mouth daily.    [provider]  metoprolol succinate (TOPROL-XL) 25 MG 24 hr tablet TAKE 1/2 TABLET BY MOUTH DAILY 12/28/23   Mealor, Roberts Gaudy, MD  Multiple Vitamins-Minerals (MULTIVITAMIN ADULT PO) Take 1 tablet by mouth daily. Centrum Silver Senior for men    [provider]  rosuvastatin (CRESTOR) 20 MG tablet Take 1 tablet by mouth daily. 06/05/14   [provider]  vitamin C (ASCORBIC ACID) 500 MG tablet Take 500 mg by mouth daily.    [provider]      Allergies    Celecoxib    Review of Systems   Review of Systems  Constitutional: Negative.   HENT: Negative.    Respiratory: Negative.    Cardiovascular: Negative.    Gastrointestinal: Negative.   Genitourinary: Negative.   Musculoskeletal: Negative.   Skin:  Positive for wound.  Neurological: Negative.   All other systems reviewed and are negative.   Physical Exam Updated Vital Signs BP 128/72 (BP Location: Left Arm)   Pulse 78   Temp 98.1 F (36.7 C) (Oral)   Resp 18   SpO2 100%  Physical Exam Vitals and nursing note reviewed.  Constitutional:      General: He is not in acute distress.    Appearance: He is well-developed. He is not ill-appearing, toxic-appearing or diaphoretic.  HENT:     Head: Atraumatic.  Eyes:     Pupils: Pupils are equal, round, and reactive to light.  Cardiovascular:     Rate and Rhythm: Normal rate and regular rhythm.     Pulses:          Dorsalis pedis pulses are 2+ on the right side and 2+ on the left side.  Pulmonary:     Effort: Pulmonary effort is normal. No respiratory distress.  Abdominal:     General: There is no distension.     Palpations: Abdomen is soft.  Musculoskeletal:        General: Normal range of motion.     Cervical back: Normal range of motion and neck supple.     Comments: No bony tenderness, full  range of motion, compartment soft. Left ankle-copious amounts of dried blood, 3mm round wound to medial aspect   Skin:    General: Skin is warm and dry.  Neurological:     General: No focal deficit present.     Mental Status: He is alert and oriented to person, place, and time.    ED Results / Procedures / Treatments   Labs (all labs ordered are listed, but only abnormal results are displayed) Labs Reviewed - No data to display  EKG None  Radiology No results found.  Procedures .Laceration Repair  Date/Time: 03/03/2024 6:20 PM  Performed by: Ralph Leyden A, PA-C Authorized by: Linwood Dibbles, PA-C   Consent:    Consent obtained:  Verbal   Consent given by:  Patient   Risks, benefits, and alternatives were discussed: yes     Risks discussed:  Infection, pain,  retained foreign body, tendon damage, vascular damage, poor wound healing, poor cosmetic result, need for additional repair and nerve damage   Alternatives discussed:  No treatment, delayed treatment, observation and referral Universal protocol:    Procedure explained and questions answered to patient or proxy's satisfaction: yes     Relevant documents present and verified: yes     Test results available: yes     Imaging studies available: yes     Required blood products, implants, devices, and special equipment available: yes     Site/side marked: yes     Immediately prior to procedure, a time out was called: yes     Patient identity confirmed:  Verbally with patient Anesthesia:    Anesthesia method:  Local infiltration   Local anesthetic:  Lidocaine 1% WITH epi (1cc) Laceration details:    Location:  Leg   Leg location:  L lower leg   Length (cm):  0.3 Pre-procedure details:    Preparation:  Patient was prepped and draped in usual sterile fashion and imaging obtained to evaluate for foreign bodies Exploration:    Hemostasis achieved with:  Tied off vessels   Imaging outcome: foreign body not noted     Wound extent: fascia not violated, no foreign body, no signs of injury, no tendon damage and no underlying fracture     Contaminated: no   Treatment:    Area cleansed with:  Povidone-iodine   Amount of cleaning:  Extensive   Irrigation method:  Pressure wash   Debridement:  None Skin repair:    Repair method:  Sutures   Suture size:  4-0   Suture material:  Prolene   Suture technique:  Figure eight   Number of sutures:  1 Approximation:    Approximation:  Close Repair type:    Repair type:  Intermediate Post-procedure details:    Dressing:  Open (no dressing)   Procedure completion:  Tolerated well, no immediate complications     Medications Ordered in ED Medications  lidocaine-EPINEPHrine (XYLOCAINE W/EPI) 2 %-1:200000 (PF) injection 10 mL (10 mLs Infiltration Given  03/03/24 1752)    ED Course/ Medical Decision Making/ A&P   78 year old chronic anticoagulation for a flutter here for evaluation of bleeding varicose vein to left lower extremity.  Started PTA.  Wife put on OTC quick clot and placed pressure dressing and subsequently brought him here.  No lightheadedness or dizziness.  He has a small slow oozing to about a 3 mm wound.  Wife does mention that he was recently doing yard work prior to this incident.  I was able to place 1 figure-of-eight  suture with cessation of bleeding.  He remained neurovascularly intact.  Placed a gentle pressure dressing over wound.  Do not feel he needs additional labs or imaging at this time.  Will have him follow-up outpatient, return for any worsening symptoms.  The patient has been appropriately medically screened and/or stabilized in the ED. I have low suspicion for any other emergent medical condition which would require further screening, evaluation or treatment in the ED or require inpatient management.  Patient is hemodynamically stable and in no acute distress.  Patient able to ambulate in department prior to ED.  Evaluation does not show acute pathology that would require ongoing or additional emergent interventions while in the emergency department or further inpatient treatment.  I have discussed the diagnosis with the patient and answered all questions.  Pain is been managed while in the emergency department and patient has no further complaints prior to discharge.  Patient is comfortable with plan discussed in room and is stable for discharge at this time.  I have discussed strict return precautions for returning to the emergency department.  Patient was encouraged to follow-up with PCP/specialist refer to at discharge.                                 Medical Decision Making Amount and/or Complexity of Data Reviewed Independent Historian: spouse External Data Reviewed: labs, radiology and notes.  Risk OTC  drugs. Prescription drug management. Decision regarding hospitalization. Diagnosis or treatment significantly limited by social determinants of health.          Final Clinical Impression(s) / ED Diagnoses Final diagnoses:  Varicose veins of left lower extremity, unspecified whether complicated  Chronic anticoagulation  Bleeding from varicose vein    Rx / DC Orders ED Discharge Orders     None         Genisis Sonnier A, PA-C 03/03/24 1827    Lorre Nick, MD 03/04/24 1528

## 2024-03-03 NOTE — ED Triage Notes (Signed)
 Patient present due to bleeding form his left leg. Patient is currently on Eliquis. He is not sure if he injured his leg. He has had an episode like this before.

## 2024-03-03 NOTE — Discharge Instructions (Addendum)
 It was a pleasure taking care of you in the ED today.  We have placed one suture to your left ankle.  This will need to be removed in about 5 days  Follow up outpatient, return for new or worsening symptoms

## 2024-03-27 ENCOUNTER — Other Ambulatory Visit: Payer: Self-pay | Admitting: Cardiovascular Disease

## 2024-03-30 DIAGNOSIS — I4719 Other supraventricular tachycardia: Secondary | ICD-10-CM | POA: Diagnosis not present

## 2024-03-30 DIAGNOSIS — I483 Typical atrial flutter: Secondary | ICD-10-CM | POA: Diagnosis not present

## 2024-03-30 DIAGNOSIS — I491 Atrial premature depolarization: Secondary | ICD-10-CM | POA: Diagnosis not present

## 2024-03-30 LAB — BASIC METABOLIC PANEL WITH GFR
BUN/Creatinine Ratio: 20 (ref 10–24)
BUN: 20 mg/dL (ref 8–27)
CO2: 23 mmol/L (ref 20–29)
Calcium: 9.5 mg/dL (ref 8.6–10.2)
Chloride: 103 mmol/L (ref 96–106)
Creatinine, Ser: 0.99 mg/dL (ref 0.76–1.27)
Glucose: 99 mg/dL (ref 70–99)
Potassium: 4.9 mmol/L (ref 3.5–5.2)
Sodium: 141 mmol/L (ref 134–144)
eGFR: 78 mL/min/{1.73_m2} (ref >59)

## 2024-03-31 ENCOUNTER — Encounter: Payer: Self-pay | Admitting: Cardiovascular Disease

## 2024-03-31 LAB — CBC
Hematocrit: 42.8 % (ref 37.5–51.0)
Hemoglobin: 13.9 g/dL (ref 13.0–17.7)
MCH: 29.5 pg (ref 26.6–33.0)
MCHC: 32.5 g/dL (ref 31.5–35.7)
MCV: 91 fL (ref 79–97)
Platelets: 219 10*3/uL (ref 150–450)
RBC: 4.71 x10E6/uL (ref 4.14–5.80)
RDW: 12.9 % (ref 11.6–15.4)
WBC: 4 10*3/uL (ref 3.4–10.8)

## 2024-04-04 ENCOUNTER — Other Ambulatory Visit: Payer: Self-pay

## 2024-04-04 ENCOUNTER — Telehealth: Payer: Self-pay

## 2024-04-04 DIAGNOSIS — I483 Typical atrial flutter: Secondary | ICD-10-CM

## 2024-04-04 NOTE — Telephone Encounter (Signed)
 I called pt to let him know that he had his pre procedure labs done too early and he will need to have them re-done. He will go around the end of [facility]a [facility] and have them re-done.   I have sent his Instruction letters via MyChart and also mailed him a copy.

## 2024-04-13 ENCOUNTER — Ambulatory Visit (HOSPITAL_COMMUNITY)
Admission: RE | Admit: 2024-04-13 | Discharge: 2024-04-13 | Disposition: A | Discharge status: Home / Self Care | Source: Ambulatory Visit | Attending: Cardiovascular Disease | Admitting: Cardiovascular Disease

## 2024-04-13 DIAGNOSIS — I3139 Other pericardial effusion (noninflammatory): Secondary | ICD-10-CM | POA: Insufficient documentation

## 2024-04-13 DIAGNOSIS — I491 Atrial premature depolarization: Secondary | ICD-10-CM

## 2024-04-13 DIAGNOSIS — I483 Typical atrial flutter: Secondary | ICD-10-CM | POA: Diagnosis not present

## 2024-04-13 DIAGNOSIS — Q2112 Patent foramen ovale: Secondary | ICD-10-CM | POA: Diagnosis not present

## 2024-04-13 DIAGNOSIS — I2510 Atherosclerotic heart disease of native coronary artery without angina pectoris: Secondary | ICD-10-CM | POA: Diagnosis not present

## 2024-04-13 DIAGNOSIS — I4719 Other supraventricular tachycardia: Secondary | ICD-10-CM | POA: Diagnosis not present

## 2024-04-13 DIAGNOSIS — I4891 Unspecified atrial fibrillation: Secondary | ICD-10-CM | POA: Insufficient documentation

## 2024-04-13 MED ORDER — IOHEXOL 350 MG/ML SOLN
100.0000 mL | Freq: Once | INTRAVENOUS | Status: AC | PRN
  Administered 2024-04-13: 100 mL via INTRAVENOUS

## 2024-04-14 ENCOUNTER — Telehealth (HOSPITAL_COMMUNITY): Payer: Self-pay

## 2024-04-14 NOTE — Telephone Encounter (Signed)
 Attempted to reach patient to discuss upcoming procedure, no answer. Left VM for patient to return call.

## 2024-04-15 DIAGNOSIS — I483 Typical atrial flutter: Secondary | ICD-10-CM | POA: Diagnosis not present

## 2024-04-15 NOTE — Telephone Encounter (Signed)
 Spoke with patient to complete pre-procedure call.     New medical conditions?  No Recent hospitalizations or surgeries? No Started any new medications? No Patient made aware to contact office to inform of any new medications started. Any changes in activities of daily living? No  Pre-procedure testing scheduled: CT and lab completed.  Confirmed patient is taking Eliquis  twice daily and will continue taking medication before procedure or it may need to be rescheduled.  Confirmed patient is scheduled for Atrial Fibrillation Ablation and Aflutter Ablation on Wednesday, May 14 with Dr. Marlane Silver. Instructed patient to arrive at the Main Entrance A at [facility]: [address] [place], [place] [address] and check in at Admitting at 6:30 AM.  Advised of plan to go home the same day and will only stay overnight if medically necessary. You MUST have a responsible adult to drive you home and MUST be with you the first 24 hours after you arrive home or your procedure could be cancelled.  Patient verbalized understanding to information provided and is agreeable to proceed with procedure.

## 2024-04-16 LAB — BASIC METABOLIC PANEL WITH GFR
BUN/Creatinine Ratio: 13 (ref 10–24)
BUN: 14 mg/dL (ref 8–27)
CO2: 21 mmol/L (ref 20–29)
Calcium: 9.3 mg/dL (ref 8.6–10.2)
Chloride: 103 mmol/L (ref 96–106)
Creatinine, Ser: 1.07 mg/dL (ref 0.76–1.27)
Glucose: 103 mg/dL — ABNORMAL HIGH (ref 70–99)
Potassium: 4.8 mmol/L (ref 3.5–5.2)
Sodium: 138 mmol/L (ref 134–144)
eGFR: 71 mL/min/{1.73_m2} (ref >59)

## 2024-04-16 LAB — CBC
Hematocrit: 41.3 % (ref 37.5–51.0)
Hemoglobin: 13.8 g/dL (ref 13.0–17.7)
MCH: 29.6 pg (ref 26.6–33.0)
MCHC: 33.4 g/dL (ref 31.5–35.7)
MCV: 88 fL (ref 79–97)
Platelets: 232 10*3/uL (ref 150–450)
RBC: 4.67 x10E6/uL (ref 4.14–5.80)
RDW: 12.7 % (ref 11.6–15.4)
WBC: 4.2 10*3/uL (ref 3.4–10.8)

## 2024-04-19 ENCOUNTER — Encounter: Payer: Self-pay | Admitting: Cardiovascular Disease

## 2024-04-21 DIAGNOSIS — I10 Essential (primary) hypertension: Secondary | ICD-10-CM | POA: Diagnosis not present

## 2024-04-21 DIAGNOSIS — Z1212 Encounter for screening for malignant neoplasm of rectum: Secondary | ICD-10-CM | POA: Diagnosis not present

## 2024-04-21 DIAGNOSIS — R82998 Other abnormal findings in urine: Secondary | ICD-10-CM | POA: Diagnosis not present

## 2024-04-21 DIAGNOSIS — I2510 Atherosclerotic heart disease of native coronary artery without angina pectoris: Secondary | ICD-10-CM | POA: Diagnosis not present

## 2024-04-21 DIAGNOSIS — E785 Hyperlipidemia, unspecified: Secondary | ICD-10-CM | POA: Diagnosis not present

## 2024-04-21 DIAGNOSIS — E038 Other specified hypothyroidism: Secondary | ICD-10-CM | POA: Diagnosis not present

## 2024-04-21 DIAGNOSIS — Z13820 Encounter for screening for osteoporosis: Secondary | ICD-10-CM | POA: Diagnosis not present

## 2024-04-21 DIAGNOSIS — N401 Enlarged prostate with lower urinary tract symptoms: Secondary | ICD-10-CM | POA: Diagnosis not present

## 2024-04-21 DIAGNOSIS — R7301 Impaired fasting glucose: Secondary | ICD-10-CM | POA: Diagnosis not present

## 2024-04-26 DIAGNOSIS — G3184 Mild cognitive impairment, so stated: Secondary | ICD-10-CM | POA: Diagnosis not present

## 2024-04-26 DIAGNOSIS — C181 Malignant neoplasm of appendix: Secondary | ICD-10-CM | POA: Diagnosis not present

## 2024-04-26 DIAGNOSIS — R7989 Other specified abnormal findings of blood chemistry: Secondary | ICD-10-CM | POA: Diagnosis not present

## 2024-04-26 DIAGNOSIS — F419 Anxiety disorder, unspecified: Secondary | ICD-10-CM | POA: Diagnosis not present

## 2024-04-26 DIAGNOSIS — R232 Flushing: Secondary | ICD-10-CM | POA: Diagnosis not present

## 2024-04-26 DIAGNOSIS — I10 Essential (primary) hypertension: Secondary | ICD-10-CM | POA: Diagnosis not present

## 2024-04-26 DIAGNOSIS — Z1339 Encounter for screening examination for other mental health and behavioral disorders: Secondary | ICD-10-CM | POA: Diagnosis not present

## 2024-04-26 DIAGNOSIS — N529 Male erectile dysfunction, unspecified: Secondary | ICD-10-CM | POA: Diagnosis not present

## 2024-04-26 DIAGNOSIS — R946 Abnormal results of thyroid function studies: Secondary | ICD-10-CM | POA: Diagnosis not present

## 2024-04-26 DIAGNOSIS — E785 Hyperlipidemia, unspecified: Secondary | ICD-10-CM | POA: Diagnosis not present

## 2024-04-26 DIAGNOSIS — I480 Paroxysmal atrial fibrillation: Secondary | ICD-10-CM | POA: Diagnosis not present

## 2024-04-26 DIAGNOSIS — E291 Testicular hypofunction: Secondary | ICD-10-CM | POA: Diagnosis not present

## 2024-04-26 DIAGNOSIS — I2510 Atherosclerotic heart disease of native coronary artery without angina pectoris: Secondary | ICD-10-CM | POA: Diagnosis not present

## 2024-04-26 DIAGNOSIS — Z1331 Encounter for screening for depression: Secondary | ICD-10-CM | POA: Diagnosis not present

## 2024-04-26 DIAGNOSIS — M8580 Other specified disorders of bone density and structure, unspecified site: Secondary | ICD-10-CM | POA: Diagnosis not present

## 2024-04-26 DIAGNOSIS — E038 Other specified hypothyroidism: Secondary | ICD-10-CM | POA: Diagnosis not present

## 2024-04-26 DIAGNOSIS — I4892 Unspecified atrial flutter: Secondary | ICD-10-CM | POA: Diagnosis not present

## 2024-04-26 DIAGNOSIS — Z0000 Encounter for general adult medical examination without abnormal findings: Secondary | ICD-10-CM | POA: Diagnosis not present

## 2024-04-27 ENCOUNTER — Telehealth (HOSPITAL_COMMUNITY): Payer: Self-pay

## 2024-04-27 NOTE — Telephone Encounter (Signed)
 Call placed to patient to discuss upcoming procedure.   CT: completed.  Labs: completed.   Any recent signs of acute illness or been started on antibiotics? No Any new medications started? No Any medications to hold? No Any missed doses of blood thinner? No Advised patient to continue taking ANTICOAGULANT: Eliquis  (Apixaban ) twice daily without missing any doses.  Medication instructions:  On the morning of your procedure DO NOT take any medication., including Eliquis  or the procedure may be rescheduled. Nothing to eat or drink after midnight prior to your procedure.  Confirmed patient is scheduled for Atrial Fibrillation Ablation, Atrial Flutter Ablation on Wednesday, May 14 with Dr. Marlane Silver. Instructed patient to arrive at the Main Entrance A at [facility]: [address] [place], [place] [address] and check in at Admitting at 6:30 AM.  Advised of plan to go home the same day and will only stay overnight if medically necessary. You MUST have a responsible adult to drive you home and MUST be with you the first 24 hours after you arrive home or your procedure could be cancelled.  Patient verbalized understanding to all instructions provided and agreed to proceed with procedure.

## 2024-05-03 NOTE — Pre-Procedure Instructions (Signed)
 Attempted to call patient regarding procedure instructions.  Left voicemail on the following items: Arrival time 0615 Nothing to eat or drink after midnight No meds AM of procedure Responsible person to drive you home and stay with you for 24 hrs  Have you missed any doses of anti-coagulant Eliquis - should be taken twice a day, if you have missed any doses please let us  know.  Don't take dose morning of procedure.

## 2024-05-04 ENCOUNTER — Ambulatory Visit (HOSPITAL_BASED_OUTPATIENT_CLINIC_OR_DEPARTMENT_OTHER): Payer: Self-pay | Admitting: Certified Registered Nurse Anesthetist

## 2024-05-04 ENCOUNTER — Encounter (HOSPITAL_COMMUNITY)
Admission: RE | Disposition: A | Discharge status: Home / Self Care | Payer: Self-pay | Source: Home / Self Care | Attending: Cardiovascular Disease

## 2024-05-04 ENCOUNTER — Ambulatory Visit (HOSPITAL_COMMUNITY): Payer: Self-pay | Admitting: Certified Registered Nurse Anesthetist

## 2024-05-04 ENCOUNTER — Other Ambulatory Visit: Payer: Self-pay

## 2024-05-04 ENCOUNTER — Encounter (HOSPITAL_COMMUNITY): Payer: Self-pay | Admitting: Cardiovascular Disease

## 2024-05-04 ENCOUNTER — Ambulatory Visit (HOSPITAL_COMMUNITY)
Admission: RE | Admit: 2024-05-04 | Discharge: 2024-05-04 | Disposition: A | Discharge status: Home / Self Care | Attending: Cardiovascular Disease | Admitting: Cardiovascular Disease

## 2024-05-04 DIAGNOSIS — Z79899 Other long term (current) drug therapy: Secondary | ICD-10-CM | POA: Insufficient documentation

## 2024-05-04 DIAGNOSIS — I4891 Unspecified atrial fibrillation: Secondary | ICD-10-CM

## 2024-05-04 DIAGNOSIS — E7800 Pure hypercholesterolemia, unspecified: Secondary | ICD-10-CM

## 2024-05-04 DIAGNOSIS — I10 Essential (primary) hypertension: Secondary | ICD-10-CM | POA: Insufficient documentation

## 2024-05-04 DIAGNOSIS — I4892 Unspecified atrial flutter: Secondary | ICD-10-CM

## 2024-05-04 DIAGNOSIS — Z7901 Long term (current) use of anticoagulants: Secondary | ICD-10-CM | POA: Insufficient documentation

## 2024-05-04 DIAGNOSIS — D6869 Other thrombophilia: Secondary | ICD-10-CM | POA: Insufficient documentation

## 2024-05-04 DIAGNOSIS — I484 Atypical atrial flutter: Secondary | ICD-10-CM | POA: Insufficient documentation

## 2024-05-04 DIAGNOSIS — I4819 Other persistent atrial fibrillation: Secondary | ICD-10-CM | POA: Diagnosis not present

## 2024-05-04 DIAGNOSIS — I351 Nonrheumatic aortic (valve) insufficiency: Secondary | ICD-10-CM | POA: Diagnosis not present

## 2024-05-04 DIAGNOSIS — E785 Hyperlipidemia, unspecified: Secondary | ICD-10-CM | POA: Insufficient documentation

## 2024-05-04 HISTORY — PX: A-FLUTTER ABLATION: EP1230

## 2024-05-04 HISTORY — PX: ATRIAL FIBRILLATION ABLATION: EP1191

## 2024-05-04 LAB — POCT ACTIVATED CLOTTING TIME
Activated Clotting Time: 268 s
Activated Clotting Time: 331 s

## 2024-05-04 SURGERY — ATRIAL FIBRILLATION ABLATION
Anesthesia: General

## 2024-05-04 MED ORDER — ATROPINE SULFATE 1 MG/10ML IJ SOSY
PREFILLED_SYRINGE | INTRAMUSCULAR | Status: AC
  Filled 2024-05-04: qty 10

## 2024-05-04 MED ORDER — OXYCODONE HCL 5 MG PO TABS: 5.0000 mg | ORAL_TABLET | Freq: Once | ORAL | Status: DC | PRN

## 2024-05-04 MED ORDER — HEPARIN (PORCINE) IN NACL 1000-0.9 UT/500ML-% IV SOLN: INTRAVENOUS | Status: DC | PRN

## 2024-05-04 MED ORDER — PHENYLEPHRINE HCL-NACL 20-0.9 MG/250ML-% IV SOLN
INTRAVENOUS | Status: DC | PRN
  Administered 2024-05-04: 30 ug/min via INTRAVENOUS

## 2024-05-04 MED ORDER — FENTANYL CITRATE (PF) 100 MCG/2ML IJ SOLN
INTRAMUSCULAR | Status: AC
  Filled 2024-05-04: qty 2

## 2024-05-04 MED ORDER — SODIUM CHLORIDE 0.9 % IV SOLN: INTRAVENOUS | Status: DC

## 2024-05-04 MED ORDER — HEPARIN SODIUM (PORCINE) 1000 UNIT/ML IJ SOLN
INTRAMUSCULAR | Status: DC | PRN
  Administered 2024-05-04: 16000 [IU] via INTRAVENOUS
  Administered 2024-05-04: 4000 [IU] via INTRAVENOUS

## 2024-05-04 MED ORDER — ONDANSETRON HCL 4 MG/2ML IJ SOLN: 4.0000 mg | Freq: Four times a day (QID) | INTRAMUSCULAR | Status: DC | PRN

## 2024-05-04 MED ORDER — LIDOCAINE 2% (20 MG/ML) 5 ML SYRINGE
INTRAMUSCULAR | Status: DC | PRN
Start: 2024-05-04 — End: 2024-05-04
  Administered 2024-05-04: 60 mg via INTRAVENOUS

## 2024-05-04 MED ORDER — OXYCODONE HCL 5 MG/5ML PO SOLN: 5.0000 mg | Freq: Once | ORAL | Status: DC | PRN

## 2024-05-04 MED ORDER — DEXAMETHASONE SODIUM PHOSPHATE 10 MG/ML IJ SOLN
INTRAMUSCULAR | Status: DC | PRN
  Administered 2024-05-04: 10 mg via INTRAVENOUS

## 2024-05-04 MED ORDER — SUGAMMADEX SODIUM 200 MG/2ML IV SOLN
INTRAVENOUS | Status: DC | PRN
  Administered 2024-05-04: 400 mg via INTRAVENOUS

## 2024-05-04 MED ORDER — ONDANSETRON HCL 4 MG/2ML IJ SOLN
INTRAMUSCULAR | Status: DC | PRN
  Administered 2024-05-04: 4 mg via INTRAVENOUS

## 2024-05-04 MED ORDER — ATROPINE SULFATE 1 MG/ML IV SOLN
INTRAVENOUS | Status: DC | PRN
  Administered 2024-05-04: 1 mg via INTRAVENOUS

## 2024-05-04 MED ORDER — SODIUM CHLORIDE 0.9 % IV SOLN: 250.0000 mL | INTRAVENOUS | Status: DC | PRN

## 2024-05-04 MED ORDER — SODIUM CHLORIDE 0.9% FLUSH: 3.0000 mL | INTRAVENOUS | Status: DC | PRN

## 2024-05-04 MED ORDER — ACETAMINOPHEN 325 MG PO TABS
650.0000 mg | ORAL_TABLET | ORAL | Status: DC | PRN
  Administered 2024-05-04: 650 mg via ORAL

## 2024-05-04 MED ORDER — PHENYLEPHRINE 80 MCG/ML (10ML) SYRINGE FOR IV PUSH (FOR BLOOD PRESSURE SUPPORT)
PREFILLED_SYRINGE | INTRAVENOUS | Status: DC | PRN
  Administered 2024-05-04 (×3): 80 ug via INTRAVENOUS

## 2024-05-04 MED ORDER — ROCURONIUM BROMIDE 10 MG/ML (PF) SYRINGE
PREFILLED_SYRINGE | INTRAVENOUS | Status: DC | PRN
  Administered 2024-05-04: 50 mg via INTRAVENOUS
  Administered 2024-05-04: 20 mg via INTRAVENOUS
  Administered 2024-05-04: 30 mg via INTRAVENOUS

## 2024-05-04 MED ORDER — SODIUM CHLORIDE 0.9% FLUSH: 3.0000 mL | Freq: Two times a day (BID) | INTRAVENOUS | Status: DC

## 2024-05-04 MED ORDER — FENTANYL CITRATE (PF) 250 MCG/5ML IJ SOLN
INTRAMUSCULAR | Status: DC | PRN
  Administered 2024-05-04: 100 ug via INTRAVENOUS

## 2024-05-04 MED ORDER — PROPOFOL 10 MG/ML IV BOLUS
INTRAVENOUS | Status: DC | PRN
  Administered 2024-05-04: 150 mg via INTRAVENOUS
  Administered 2024-05-04: 50 mg via INTRAVENOUS

## 2024-05-04 MED ORDER — FENTANYL CITRATE (PF) 100 MCG/2ML IJ SOLN: 25.0000 ug | INTRAMUSCULAR | Status: DC | PRN

## 2024-05-04 MED ORDER — PROTAMINE SULFATE 10 MG/ML IV SOLN
INTRAVENOUS | Status: DC | PRN
  Administered 2024-05-04: 50 mg via INTRAVENOUS

## 2024-05-04 MED ORDER — ACETAMINOPHEN 325 MG PO TABS
ORAL_TABLET | ORAL | Status: AC
  Filled 2024-05-04: qty 2

## 2024-05-04 MED ORDER — HEPARIN (PORCINE) IN NACL 2000-0.9 UNIT/L-% IV SOLN
INTRAVENOUS | Status: DC | PRN
  Administered 2024-05-04 (×4): 1000 mL

## 2024-05-04 SURGICAL SUPPLY — 20 items
BAG SNAP BAND KOVER 36X36 (MISCELLANEOUS) IMPLANT
BLANKET WARM UNDERBOD FULL ACC (MISCELLANEOUS) ×1 IMPLANT
CABLE PFA RX CATH CONN (CABLE) IMPLANT
CATH FARAWAVE ABLATION 31 (CATHETERS) IMPLANT
CATH GE 8FR SOUNDSTAR (CATHETERS) IMPLANT
CATH OCTARAY 2.0 F 3-3-3-3-3 (CATHETERS) IMPLANT
CATH WEBSTER BI DIR CS D-F CRV (CATHETERS) IMPLANT
CLOSURE PERCLOSE PROSTYLE (VASCULAR PRODUCTS) IMPLANT
COVER SWIFTLINK CONNECTOR (BAG) ×1 IMPLANT
DEVICE CLOSURE MYNXGRIP 6/7F (Vascular Products) IMPLANT
DILATOR VESSEL 38 20CM 16FR (INTRODUCER) IMPLANT
GUIDEWIRE INQWIRE 1.5J.035X260 (WIRE) IMPLANT
KIT VERSACROSS CNCT FARADRIVE (KITS) IMPLANT
PACK EP LF (CUSTOM PROCEDURE TRAY) ×1 IMPLANT
PAD DEFIB RADIO PHYSIO CONN (PAD) ×1 IMPLANT
PATCH CARTO3 (PAD) IMPLANT
SHEATH AVANTI 11CM 9FR (SHEATH) IMPLANT
SHEATH FARADRIVE STEERABLE (SHEATH) IMPLANT
SHEATH PINNACLE 8F 10CM (SHEATH) IMPLANT
SHEATH PROBE COVER 6X72 (BAG) IMPLANT

## 2024-05-04 NOTE — H&P (Signed)
 Cardiology Office Note:    Date:  05/04/2024   ID:  Austin Parks, DOB 28-Sep-1946, MRN [id number]  PCP:  Aldo Hun, MD   [facility] Providers Cardiologist:  None Electrophysiologist:  Efraim Grange, MD     Referring MD: No ref. provider found   Chief complaint: here for arrhythmia follow-up  History of Present Illness:    Austin Parks is a 78 y.o. male with a hx of hyperlipidemia, hypertension and additional issues detailed below referred for arrhythmia management.   He was last seen in our clinic by Dr. Rodolfo Clan in December, 2022. The patient underwent EP testing and ablation of an atypical atrial flutter in Sept 2017. He returned the following year with palpitations and recordings of tachycardia from an Apple Watch.  A Holter monitor then showed nonsustained atrial tachycardia and PVCs.  An event recorder was placed in August 2022 that showed atrial high rate episodes, but the longest episode was only a few seconds.  I reviewed multiple tracings from his home rhythm monitor.  Many of these are labeled as possible atrial fibrillation, but P waves can clearly be seen indicating sinus rhythm with frequent PACs.  His symptoms are very mild.  He gets a sense of fullness in his throat.  He does not have significant palpitations, chest pain, dependent edema, syncope, presyncope.  I reviewed the patient's CT and labs. There was no LAA thrombus. he  has not missed any doses of anticoagulation, and he took his dose last night. There have been no changes in the patient's diagnoses, medications, or condition since our recent clinic visit.    EKGs/Labs/Other Studies Reviewed:    Monitor: 12/04/2022 Sinus rhythm HR 50-117, avg 72 bpm Atrial fibrillation burden 2%, HR 53-159, avg 76, longest duration 2 hr 16 min There was frequent supraventricular ectopy comprising 16.3% of beats Patient triggered events correlated with supraventricular ectopy  EKG:  Last EKG results:  today - sinus rhythm with frequent PACs   Recent Labs: 04/15/2024: BUN 14; Creatinine, Ser 1.07; Hemoglobin 13.8; Platelets 232; Potassium 4.8; Sodium 138     Physical Exam:    VS:  BP (!) 150/86   Pulse 76   Temp 98 F (36.7 C) (Oral)   Resp 20   Ht 6\' 2"  (1.88 m)   Wt 90.3 kg   SpO2 96%   BMI 25.55 kg/m     Wt Readings from Last 3 Encounters:  05/04/24 90.3 kg  02/29/24 91.2 kg  04/30/23 90 kg     GEN:  Well nourished, well developed in no acute distress CARDIAC: Irregular rhythm, no murmurs, rubs, gallops RESPIRATORY:  Normal work of breathing MUSCULOSKELETAL: no edema    ASSESSMENT & PLAN:    Atrial fibrillation:  AF documented on monitor 11/2022.  Burden is low at 2%.  No obvious symptoms.  Rate is controlled We discussed management options.  Using a shared decision approach, we decided to proceed with ablation at the time of atrial flutter ablation.  We discussed the indication, rationale, logistics, anticipated benefits, and potential risks of the ablation procedure including but not limited to -- bleed at the groin access site, chest pain, damage to nearby organs such as the diaphragm, lungs, or esophagus, need for a drainage tube, or prolonged hospitalization. I explained that the risk for stroke, heart attack, need for open chest surgery, or even death is very low but not zero. he  expressed understanding and wishes to proceed.   Atrial flutter:  s/p ablation in 2017  EKG today shows recurrence of flutter We discussed management options and we will plan to schedule mapping and repeat ablation of flutter. Rate is currently controlled with regular 4:1 conduction.  Secondary hypercoagulable state:  continue apixaban   History of bradycardia:  I suspect this is apical dissociation -- he does not have a strong pulse with closely coupled PACs. Will evaluate with monitor        Medication Adjustments/Labs and Tests Ordered: Current medicines are reviewed  at length with the patient today.  Concerns regarding medicines are outlined above.  Orders Placed This Encounter  Procedures   Informed Consent Details: Physician/Practitioner Attestation; Transcribe to consent form and obtain patient signature   Initiate Pre-op Protocol   Void on call to EP Lab   Confirm CBC and BMP (or CMP) results within 7 days for inpatient and 30 days for outpatient:   Clip right and left femoral area PM before surgery   Clip right internal jugular area PM before surgery   Pre-admission testing diagnosis   EP STUDY   Insert peripheral IV   Meds ordered this encounter  Medications   0.9 %  sodium chloride  infusion     Signed, Efraim Grange, MD  05/04/2024 7:19 AM    [facility]

## 2024-05-04 NOTE — Anesthesia Procedure Notes (Signed)
 Procedure Name: Intubation Date/Time: 05/04/2024 8:14 AM  Performed by: Lenette Quick, CRNAPre-anesthesia Checklist: Patient identified, Emergency Drugs available, Suction available and Patient being monitored Patient Re-evaluated:Patient Re-evaluated prior to induction Oxygen Delivery Method: Circle system utilized Preoxygenation: Pre-oxygenation with 100% oxygen Induction Type: IV induction Ventilation: Mask ventilation without difficulty Grade View: Grade I Tube type: Oral Tube size: 7.5 mm Number of attempts: 1 Airway Equipment and Method: Stylet and Oral airway Placement Confirmation: ETT inserted through vocal cords under direct vision, positive ETCO2 and breath sounds checked- equal and bilateral Tube secured with: Tape Dental Injury: Teeth and Oropharynx as per pre-operative assessment

## 2024-05-04 NOTE — Transfer of Care (Signed)
 Immediate Anesthesia Transfer of Care Note  Patient: Kailee Dobmeier Mcelhinny  Procedure(s) Performed: ATRIAL FIBRILLATION ABLATION A-FLUTTER ABLATION  Patient Location: PACU  Anesthesia Type:General  Level of Consciousness: awake, alert , and patient cooperative  Airway & Oxygen Therapy: Patient Spontanous Breathing and Patient connected to nasal cannula oxygen  Post-op Assessment: Report given to RN and Post -op Vital signs reviewed and stable  Post vital signs: Reviewed and stable  Last Vitals:  Vitals Value Taken Time  BP 120/80   Temp    Pulse 68   Resp 14   SpO2 100     Last Pain:  Vitals:   05/04/24 0626  TempSrc:   PainSc: 0-No pain      Patients Stated Pain Goal: 4 (05/04/24 0626)  Complications: There were no known notable events for this encounter.

## 2024-05-04 NOTE — Anesthesia Preprocedure Evaluation (Signed)
 Anesthesia Evaluation  Patient identified by MRN, date of birth, ID band Patient awake    Reviewed: Allergy & Precautions, H&P , NPO status , Patient's Chart, lab work & pertinent test results  Airway Mallampati: II   Neck ROM: full    Dental   Pulmonary neg pulmonary ROS   breath sounds clear to auscultation       Cardiovascular + dysrhythmias Atrial Fibrillation  Rhythm:irregular Rate:Normal     Neuro/Psych    GI/Hepatic ,GERD  ,,  Endo/Other    Renal/GU      Musculoskeletal   Abdominal   Peds  Hematology   Anesthesia Other Findings   Reproductive/Obstetrics                             Anesthesia Physical Anesthesia Plan  ASA: 3  Anesthesia Plan: General   Post-op Pain Management:    Induction: Intravenous  PONV Risk Score and Plan: 2 and Ondansetron , Dexamethasone , Midazolam  and Treatment may vary due to age or medical condition  Airway Management Planned: Oral ETT  Additional Equipment:   Intra-op Plan:   Post-operative Plan: Extubation in OR  Informed Consent: I have reviewed the patients History and Physical, chart, labs and discussed the procedure including the risks, benefits and alternatives for the proposed anesthesia with the patient or authorized representative who has indicated his/her understanding and acceptance.     Dental advisory given  Plan Discussed with: CRNA, Anesthesiologist and Surgeon  Anesthesia Plan Comments:        Anesthesia Quick Evaluation

## 2024-05-04 NOTE — Anesthesia Postprocedure Evaluation (Signed)
 Anesthesia Post Note  Patient: Austin Parks  Procedure(Parks) Performed: ATRIAL FIBRILLATION ABLATION A-FLUTTER ABLATION     Patient location during evaluation: PACU Anesthesia Type: General Level of consciousness: awake and alert Pain management: pain level controlled Vital Signs Assessment: post-procedure vital signs reviewed and stable Respiratory status: spontaneous breathing, nonlabored ventilation, respiratory function stable and patient connected to nasal cannula oxygen Cardiovascular status: blood pressure returned to baseline and stable Postop Assessment: no apparent nausea or vomiting Anesthetic complications: no   There were no known notable events for this encounter.  Last Vitals:  Vitals:   05/04/24 1400 05/04/24 1415  BP: 127/81 119/68  Pulse: 72 73  Resp: 14 17  Temp:    SpO2: 97% 98%    Last Pain:  Vitals:   05/04/24 1300  TempSrc:   PainSc: 2                  Austin Parks

## 2024-05-04 NOTE — Discharge Instructions (Signed)

## 2024-05-05 ENCOUNTER — Telehealth (HOSPITAL_COMMUNITY): Payer: Self-pay

## 2024-05-05 NOTE — Telephone Encounter (Signed)
 Spoke with patient to complete post procedure follow up call.  Patient reports he noticed last night a small amount of blood on the gauze pad at the right groin site. He denies any changes to bandage on this morning.   Instructions reviewed with patient:  Get help right away if your puncture site is bleeding and the bleeding does not stop after applying firm pressure to the area.  Remove large bandage at puncture site after 24 hours. It is normal to have bruising, tenderness and a pea or marble sized lump/knot at the groin site which can take up to three months to resolve.  Get help right away if you notice sudden swelling at the puncture site.  Check your puncture site every day for signs of infection: fever, redness, swelling, pus drainage, warmth, foul odor or excessive pain. If this occurs, please call the office at [phone], to speak with the nurse. You may continue to have skipped beats/ atrial fibrillation during the first several months after your procedure.  It is very important not to miss any doses of your blood thinner Eliquis . Patient restarted taking this medication on 05/04/24.   You will follow up with the Afib clinic on 06/01/24 and follow up with the APP on 08/08/24.    Patient verbalized understanding to all instructions provided.

## 2024-05-05 NOTE — Telephone Encounter (Signed)
 Attempted to reach patient to follow up with procedure completed on 05/04/24, no answer. Left VM for patient to return call.

## 2024-05-06 MED FILL — Fentanyl Citrate Preservative Free (PF) Inj 100 MCG/2ML: INTRAMUSCULAR | Qty: 2 | Status: AC

## 2024-05-06 MED FILL — Atropine Sulfate Soln Prefill Syr 1 MG/10ML (0.1 MG/ML): INTRAMUSCULAR | Qty: 10 | Status: AC

## 2024-05-19 ENCOUNTER — Encounter: Payer: Self-pay | Admitting: Emergency Medicine

## 2024-05-25 DIAGNOSIS — Z8509 Personal history of malignant neoplasm of other digestive organs: Secondary | ICD-10-CM | POA: Diagnosis not present

## 2024-05-25 DIAGNOSIS — C181 Malignant neoplasm of appendix: Secondary | ICD-10-CM | POA: Diagnosis not present

## 2024-05-25 DIAGNOSIS — D490 Neoplasm of unspecified behavior of digestive system: Secondary | ICD-10-CM | POA: Diagnosis not present

## 2024-06-01 ENCOUNTER — Ambulatory Visit (HOSPITAL_COMMUNITY)
Admission: RE | Admit: 2024-06-01 | Discharge: 2024-06-01 | Disposition: A | Discharge status: Home / Self Care | Source: Ambulatory Visit | Attending: Physician Assistant | Admitting: Physician Assistant

## 2024-06-01 ENCOUNTER — Encounter (HOSPITAL_COMMUNITY): Payer: Self-pay | Admitting: Physician Assistant

## 2024-06-01 VITALS — BP 116/76 | HR 77 | Ht 74.0 in | Wt 200.6 lb

## 2024-06-01 DIAGNOSIS — I480 Paroxysmal atrial fibrillation: Secondary | ICD-10-CM | POA: Diagnosis not present

## 2024-06-01 DIAGNOSIS — D6869 Other thrombophilia: Secondary | ICD-10-CM | POA: Diagnosis not present

## 2024-06-01 DIAGNOSIS — Z8679 Personal history of other diseases of the circulatory system: Secondary | ICD-10-CM

## 2024-06-01 DIAGNOSIS — I483 Typical atrial flutter: Secondary | ICD-10-CM | POA: Diagnosis not present

## 2024-06-01 NOTE — Progress Notes (Signed)
 Primary Care Physician: Aldo Hun, MD Primary Cardiologist: None Electrophysiologist: Efraim Grange, MD  Referring Physician: Dr Simeon Duane is a 78 y.o. male with a history of CAD, HTN, HLD, atrial flutter, atrial fibrillation who presents for follow up in the [facility].  The patient was diagnosed with atrial flutter and underwent typical flutter ablation in 2017.  He was later found to have atrial fibrillation and underwent afib ablation with Dr Arlester Ladd on 05/04/24. Patient is on Eliquis  for stroke prevention.    Patient presents today for follow up for atrial fibrillation. He reports that he has done well since the ablation with no interim symptoms of afib. He denies chest pain or groin issues. No bleeding issues on anticoagulation.   Today, he denies symptoms of palpitations, chest pain, shortness of breath, orthopnea, PND, lower extremity edema, dizziness, presyncope, syncope, snoring, daytime somnolence, bleeding, or neurologic sequela. The patient is tolerating medications without difficulties and is otherwise without complaint today.    Atrial Fibrillation Risk Factors:  he does not have symptoms or diagnosis of sleep apnea. he does not have a history of rheumatic fever.   Atrial Fibrillation Management history:  Previous antiarrhythmic drugs: none Previous cardioversions: none Previous ablations: flutter 2017, 05/04/24 Anticoagulation history: Eliquis   ROS- All systems are reviewed and negative except as per the HPI above.  Past Medical History:  Diagnosis Date   Atrial tachycardia (HCC)    Back pain, chronic    Benign prostatic hypertrophy    with obstruction   Bradycardia    ectopic   Erectile dysfunction of organic origin    Gastroesophageal reflux disease    Hypercholesterolemia    borderline. admitted january 2011 for chest pain-all stress related it was felt to be a-flutter 7/17   Mild aortic insufficiency     Mild mitral regurgitation    Premature atrial contractions    PVC's (premature ventricular contractions)    Typical atrial flutter (HCC)    a. reverse typical atrial flutter s/p ablation 08/2016.    Current Outpatient Medications  Medication Sig Dispense Refill   cholecalciferol (VITAMIN D ) 1000 units tablet Take 2,000 Units by mouth daily.     Coenzyme Q10 200 MG capsule Take 200 mg by mouth daily.      ELIQUIS  5 MG TABS tablet Take 5 mg by mouth 2 (two) times daily.     ezetimibe (ZETIA) 10 MG tablet Take 10 mg by mouth daily.     Multiple Vitamins-Minerals (MULTIVITAMIN ADULT PO) Take 1 tablet by mouth daily. Centrum Silver Senior for men     rosuvastatin  (CRESTOR ) 40 MG tablet Take 20 mg by mouth daily.     vitamin C (ASCORBIC ACID) 500 MG tablet Take 250 mg by mouth daily.     No current facility-administered medications for this encounter.    Physical Exam: BP 116/76   Pulse 77   Ht 6' 2 (1.88 m)   Wt 91 kg   BMI 25.76 kg/m   GEN: Well nourished, well developed in no acute distress CARDIAC: Regular rate and rhythm, no murmurs, rubs, gallops RESPIRATORY:  Clear to auscultation without rales, wheezing or rhonchi  ABDOMEN: Soft, non-tender, non-distended EXTREMITIES:  No edema; No deformity   Wt Readings from Last 3 Encounters:  06/01/24 91 kg  05/04/24 90.3 kg  02/29/24 91.2 kg     EKG today demonstrates  SR, 1st degree AV block Vent. rate 77 BPM PR interval 230 ms  QRS duration 82 ms QT/QTcB 368/416 ms   Echo 06/12/21 demonstrated   1. Left ventricular ejection fraction, by estimation, is 55 to 60%. The  left ventricle has normal function. The left ventricle has no regional  wall motion abnormalities. Left ventricular diastolic parameters were  normal.   2. Right ventricular systolic function is normal. The right ventricular  size is normal.   3. Left atrial size was mildly dilated.   4. A small pericardial effusion is present.   5. The mitral valve is  normal in structure. Trivial mitral valve  regurgitation. No evidence of mitral stenosis.   6. The aortic valve is tricuspid. There is mild calcification of the  aortic valve. There is mild thickening of the aortic valve. Aortic valve  regurgitation is mild.   7. The inferior vena cava is normal in size with greater than 50%  respiratory variability, suggesting right atrial pressure of 3 mmHg.   Comparison(s): No significant change from prior study.   Conclusion(s)/Recommendation(s): Otherwise normal echocardiogram, with  minor abnormalities described in the report.     CHA2DS2-VASc Score = 4  The patient's score is based upon: CHF History: 0 HTN History: 1 Diabetes History: 0 Stroke History: 0 Vascular Disease History: 1 Age Score: 2 Gender Score: 0       ASSESSMENT AND PLAN: Paroxysmal Atrial Fibrillation/atrial flutter The patient's CHA2DS2-VASc score is 4, indicating a 4.8% annual risk of stroke.   S/p atrial flutter ablation 2017, afib ablation 05/04/24 Patient appears to be maintaining SR Continue Eliquis  5 mg BID with no missed doses for 3 months post ablation.   Secondary Hypercoagulable State (ICD10:  D68.69) The patient is at significant risk for stroke/thromboembolism based upon his CHA2DS2-VASc Score of 4.  Continue Apixaban  (Eliquis ). No bleeding issues.   CAD CAC score 621 on CT No anginal symptoms  HTN Stable on current regimen    Follow up with Michaelle Adolphus as scheduled.       [facility] [address] [place], [place] [address] [phone]

## 2024-06-08 DIAGNOSIS — I480 Paroxysmal atrial fibrillation: Secondary | ICD-10-CM | POA: Diagnosis not present

## 2024-06-08 DIAGNOSIS — I2510 Atherosclerotic heart disease of native coronary artery without angina pectoris: Secondary | ICD-10-CM | POA: Diagnosis not present

## 2024-06-08 DIAGNOSIS — E785 Hyperlipidemia, unspecified: Secondary | ICD-10-CM | POA: Diagnosis not present

## 2024-06-08 DIAGNOSIS — G3184 Mild cognitive impairment, so stated: Secondary | ICD-10-CM | POA: Diagnosis not present

## 2024-06-08 DIAGNOSIS — I10 Essential (primary) hypertension: Secondary | ICD-10-CM | POA: Diagnosis not present

## 2024-07-27 DIAGNOSIS — M25562 Pain in left knee: Secondary | ICD-10-CM | POA: Diagnosis not present

## 2024-07-27 DIAGNOSIS — R600 Localized edema: Secondary | ICD-10-CM | POA: Diagnosis not present

## 2024-07-30 DIAGNOSIS — M1712 Unilateral primary osteoarthritis, left knee: Secondary | ICD-10-CM | POA: Diagnosis not present

## 2024-08-07 NOTE — Progress Notes (Unsigned)
  Electrophysiology Office Note:   Date:  08/08/2024  ID:  Austin Parks, DOB 1946/02/01, MRN [id number]  Primary Cardiologist: None Electrophysiologist: Eulas FORBES Furbish, MD   Electrophysiologist:  Eulas FORBES Furbish, MD      History of Present Illness:   Austin Parks is a 78 y.o. male with h/o CAD, HTN, HLD, atrial flutter, atrial fibrillation seen today for routine electrophysiology follow-up s/p Ablation.  Since last being seen in our clinic the patient reports doing very well.  he denies chest pain, palpitations, dyspnea, PND, orthopnea, nausea, vomiting, dizziness, syncope, edema, weight gain, or early satiety.    Review of systems complete and found to be negative unless listed in HPI.   EP Information / Studies Reviewed:    EKG is ordered today. Personal review as below.  EKG Interpretation Date/Time:  Monday August 08 2024 11:19:18 EDT Ventricular Rate:  65 PR Interval:  242 QRS Duration:  82 QT Interval:  380 QTC Calculation: 395 R Axis:   47  Text Interpretation: Sinus rhythm with 1st degree A-V block When compared with ECG of 01-Jun-2024 13:17, Premature atrial complexes are no longer Present Confirmed by Lesia Sharper [phone]) on 08/08/2024 11:32:42 AM    Arrhythmia/Device History S/p CTI 2017 S/p PVI 05/04/2024   Physical Exam:   VS:  BP 137/78   Pulse 65   Ht 6' 2 (1.88 m)   Wt 198 lb (89.8 kg)   SpO2 98%   BMI 25.42 kg/m    Wt Readings from Last 3 Encounters:  08/08/24 198 lb (89.8 kg)  06/01/24 200 lb 9.6 oz (91 kg)  05/04/24 199 lb (90.3 kg)     GEN: No acute distress NECK: No JVD; No carotid bruits CARDIAC: Regular rate and rhythm, no murmurs, rubs, gallops RESPIRATORY:  Clear to auscultation without rales, wheezing or rhonchi  ABDOMEN: Soft, non-tender, non-distended EXTREMITIES:  No edema; No deformity   ASSESSMENT AND PLAN:    Persistent AF Persistent AFL EKG today shows NSR S/p multiple ablations as above.  Continue  Eliquis  5 mg BID for CHA2DS2VASc of at least 4   Secondary hypercoagulable state Pt on Eliquis  as above   CAD CAC Score 621 on CT No s/s of ischemia.      Follow up with EP Team in 6 months  Signed, Sharper Prentice Lesia, PA-C

## 2024-08-08 ENCOUNTER — Encounter: Payer: Self-pay | Admitting: Student

## 2024-08-08 ENCOUNTER — Ambulatory Visit: Attending: Student | Admitting: Student

## 2024-08-08 VITALS — BP 137/78 | HR 65 | Ht 74.0 in | Wt 198.0 lb

## 2024-08-08 DIAGNOSIS — I480 Paroxysmal atrial fibrillation: Secondary | ICD-10-CM

## 2024-08-08 DIAGNOSIS — I483 Typical atrial flutter: Secondary | ICD-10-CM | POA: Diagnosis not present

## 2024-08-08 DIAGNOSIS — D6869 Other thrombophilia: Secondary | ICD-10-CM | POA: Diagnosis not present

## 2024-08-08 NOTE — Patient Instructions (Signed)
 Medication Instructions:  Your physician recommends that you continue on your current medications as directed. Please refer to the Current Medication list given to you today.  *If you need a refill on your cardiac medications before your next appointment, please call your pharmacy*  Lab Work: None ordered If you have labs (blood work) drawn today and your tests are completely normal, you will receive your results only by: MyChart Message (if you have MyChart) OR A paper copy in the mail If you have any lab test that is abnormal or we need to change your treatment, we will call you to review the results.  Follow-Up: At [facility], you and your health needs are our priority.  As part of our continuing mission to provide you with exceptional heart care, our providers are all part of one team.  This team includes your primary Cardiologist (physician) and Advanced Practice Providers or APPs (Physician Assistants and Nurse Practitioners) who all work together to provide you with the care you need, when you need it.  Your next appointment:   6 month(s)  Provider:   You may see Eulas FORBES Furbish, MD or one of the following Advanced Practice Providers on your designated Care Team:   Charlies Arthur, [place] Ozell Jodie Passey, PA-C Suzann Riddle, NP Daphne Barrack, NP

## 2024-08-30 DIAGNOSIS — L309 Dermatitis, unspecified: Secondary | ICD-10-CM | POA: Diagnosis not present

## 2024-08-30 DIAGNOSIS — L821 Other seborrheic keratosis: Secondary | ICD-10-CM | POA: Diagnosis not present

## 2024-09-29 DIAGNOSIS — Z23 Encounter for immunization: Secondary | ICD-10-CM | POA: Diagnosis not present

## 2024-09-29 DIAGNOSIS — E038 Other specified hypothyroidism: Secondary | ICD-10-CM | POA: Diagnosis not present

## 2024-09-29 DIAGNOSIS — E291 Testicular hypofunction: Secondary | ICD-10-CM | POA: Diagnosis not present

## 2024-11-09 DIAGNOSIS — H2513 Age-related nuclear cataract, bilateral: Secondary | ICD-10-CM | POA: Diagnosis not present

## 2024-11-09 DIAGNOSIS — H5203 Hypermetropia, bilateral: Secondary | ICD-10-CM | POA: Diagnosis not present

## 2024-11-25 DIAGNOSIS — D490 Neoplasm of unspecified behavior of digestive system: Secondary | ICD-10-CM | POA: Diagnosis not present

## 2024-11-25 DIAGNOSIS — K5730 Diverticulosis of large intestine without perforation or abscess without bleeding: Secondary | ICD-10-CM | POA: Diagnosis not present

## 2024-11-25 DIAGNOSIS — K429 Umbilical hernia without obstruction or gangrene: Secondary | ICD-10-CM | POA: Diagnosis not present

## 2024-11-25 DIAGNOSIS — Z8509 Personal history of malignant neoplasm of other digestive organs: Secondary | ICD-10-CM | POA: Diagnosis not present

## 2024-11-25 DIAGNOSIS — C181 Malignant neoplasm of appendix: Secondary | ICD-10-CM | POA: Diagnosis not present

## 2024-11-25 DIAGNOSIS — N281 Cyst of kidney, acquired: Secondary | ICD-10-CM | POA: Diagnosis not present

## 2025-02-09 ENCOUNTER — Ambulatory Visit: Admitting: Student

## 2025-02-16 ENCOUNTER — Ambulatory Visit: Admitting: Student
